# Patient Record
Sex: Male | Born: 1960 | Race: White | Hispanic: No | Marital: Married | Smoking: Current every day smoker
Health system: Southern US, Community
[De-identification: ages and names within clinical notes are randomized; demographics above are authoritative.]

## PROBLEM LIST (undated history)

## (undated) DIAGNOSIS — B1920 Unspecified viral hepatitis C without hepatic coma: Secondary | ICD-10-CM

## (undated) DIAGNOSIS — G8929 Other chronic pain: Secondary | ICD-10-CM

## (undated) DIAGNOSIS — M1990 Unspecified osteoarthritis, unspecified site: Secondary | ICD-10-CM

## (undated) DIAGNOSIS — J449 Chronic obstructive pulmonary disease, unspecified: Secondary | ICD-10-CM

## (undated) HISTORY — DX: Unspecified viral hepatitis C without hepatic coma: B19.20

## (undated) HISTORY — PX: OTHER SURGICAL HISTORY: SHX169

## (undated) HISTORY — PX: FACIAL FRACTURE SURGERY: SHX1570

## (undated) HISTORY — PX: ANKLE FRACTURE SURGERY: SHX122

---

## 2005-03-09 ENCOUNTER — Ambulatory Visit: Payer: Self-pay | Admitting: Family Medicine

## 2006-01-01 ENCOUNTER — Emergency Department (HOSPITAL_COMMUNITY): Admission: EM | Admit: 2006-01-01 | Discharge: 2006-01-02 | Payer: Self-pay | Admitting: Emergency Medicine

## 2006-01-02 ENCOUNTER — Inpatient Hospital Stay (HOSPITAL_COMMUNITY): Admission: AC | Admit: 2006-01-02 | Discharge: 2006-01-10 | Payer: Self-pay

## 2006-01-04 ENCOUNTER — Ambulatory Visit: Payer: Self-pay | Admitting: Physical Medicine & Rehabilitation

## 2006-03-01 ENCOUNTER — Ambulatory Visit (HOSPITAL_COMMUNITY): Admission: RE | Admit: 2006-03-01 | Discharge: 2006-03-01 | Payer: Self-pay | Admitting: Neurosurgery

## 2006-04-06 ENCOUNTER — Ambulatory Visit (HOSPITAL_COMMUNITY): Admission: RE | Admit: 2006-04-06 | Discharge: 2006-04-06 | Payer: Self-pay | Admitting: Neurosurgery

## 2006-05-12 ENCOUNTER — Ambulatory Visit (HOSPITAL_COMMUNITY): Admission: RE | Admit: 2006-05-12 | Discharge: 2006-05-12 | Payer: Self-pay | Admitting: Neurosurgery

## 2006-09-10 ENCOUNTER — Emergency Department (HOSPITAL_COMMUNITY): Admission: AC | Admit: 2006-09-10 | Discharge: 2006-09-10 | Payer: Self-pay | Admitting: Orthopedic Surgery
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
## 2007-08-24 ENCOUNTER — Encounter: Payer: Self-pay | Admitting: Family Medicine

## 2010-09-13 ENCOUNTER — Encounter: Payer: Self-pay | Admitting: Family Medicine

## 2010-09-22 NOTE — Letter (Signed)
Summary: Historic Patient File  Historic Patient File   Imported By: Lind Guest 07/14/2010 09:07:54  _____________________________________________________________________  External Attachment:    Type:   Image     Comment:   External Document

## 2014-08-20 ENCOUNTER — Emergency Department (HOSPITAL_COMMUNITY): Payer: Medicaid Other

## 2014-08-20 ENCOUNTER — Encounter (HOSPITAL_COMMUNITY): Payer: Self-pay | Admitting: Emergency Medicine

## 2014-08-20 ENCOUNTER — Emergency Department (HOSPITAL_COMMUNITY)
Admission: EM | Admit: 2014-08-20 | Discharge: 2014-08-20 | Disposition: A | Discharge status: Home / Self Care | Payer: Medicaid Other | Attending: Emergency Medicine | Admitting: Emergency Medicine

## 2014-08-20 DIAGNOSIS — R0781 Pleurodynia: Secondary | ICD-10-CM

## 2014-08-20 DIAGNOSIS — W19XXXA Unspecified fall, initial encounter: Secondary | ICD-10-CM

## 2014-08-20 DIAGNOSIS — W01198A Fall on same level from slipping, tripping and stumbling with subsequent striking against other object, initial encounter: Secondary | ICD-10-CM | POA: Diagnosis not present

## 2014-08-20 DIAGNOSIS — S3991XA Unspecified injury of abdomen, initial encounter: Secondary | ICD-10-CM | POA: Insufficient documentation

## 2014-08-20 DIAGNOSIS — Z8739 Personal history of other diseases of the musculoskeletal system and connective tissue: Secondary | ICD-10-CM | POA: Diagnosis not present

## 2014-08-20 DIAGNOSIS — Y998 Other external cause status: Secondary | ICD-10-CM | POA: Insufficient documentation

## 2014-08-20 DIAGNOSIS — S299XXA Unspecified injury of thorax, initial encounter: Secondary | ICD-10-CM | POA: Insufficient documentation

## 2014-08-20 DIAGNOSIS — S3992XA Unspecified injury of lower back, initial encounter: Secondary | ICD-10-CM | POA: Insufficient documentation

## 2014-08-20 DIAGNOSIS — Y9389 Activity, other specified: Secondary | ICD-10-CM | POA: Insufficient documentation

## 2014-08-20 DIAGNOSIS — Z720 Tobacco use: Secondary | ICD-10-CM | POA: Insufficient documentation

## 2014-08-20 DIAGNOSIS — Y9289 Other specified places as the place of occurrence of the external cause: Secondary | ICD-10-CM | POA: Insufficient documentation

## 2014-08-20 HISTORY — DX: Unspecified osteoarthritis, unspecified site: M19.90

## 2014-08-20 MED ORDER — OXYCODONE-ACETAMINOPHEN 5-325 MG PO TABS: 1.0000 | ORAL_TABLET | ORAL | Status: DC | PRN

## 2014-08-20 MED ORDER — IBUPROFEN 800 MG PO TABS: 800.0000 mg | ORAL_TABLET | Freq: Three times a day (TID) | ORAL | Status: DC | PRN

## 2014-08-20 MED ORDER — OXYCODONE-ACETAMINOPHEN 5-325 MG PO TABS
2.0000 | ORAL_TABLET | Freq: Once | ORAL | Status: AC
  Administered 2014-08-20: 2 via ORAL
  Filled 2014-08-20: qty 2

## 2014-08-20 NOTE — ED Provider Notes (Signed)
This chart was scribed for [place], DO by [facility], ED Scribe. The patient was seen in [facility] and the patient's care was started at 12:07 PM.  TIME SEEN: 12:07 AM  CHIEF COMPLAINT: Lower left sided back pain and left flank pain  HPI:  Curtis Baldwin is a 53 y.o. male with a history of arthritis who presents to the Emergency Department complaining of lower left sided back pain and left flank pain, onset 4 days ago after a fall. The pain radiates to the left side of his neck. Pt fell on the sidewalk 4 days ago and hit his face. Denies loss of consciousness. Not on anticoagulation. He has used a heating pad for no relief. Movement, coughing and deep breathing worsens the  pain. Pt states his last tetanus was in the last 5 years ago. He denies CP, numbness, tingling, and focal weakness. No shortness of breath. No cough. No fever.  ROS: See HPI Constitutional: no fever  Eyes: no drainage  ENT: no runny nose   Cardiovascular:  chest pain  Resp: no SOB  GI: no vomiting GU: no dysuria Integumentary: no rash  Allergy: no hives  Musculoskeletal: no leg swelling left flank pain, lower left sided back pain Neurological: no slurred speech, no numbness, no tingling, no focal weakness ROS otherwise negative  PAST MEDICAL HISTORY/PAST SURGICAL HISTORY:  Past Medical History  Diagnosis Date  . Arthritis     MEDICATIONS:  Prior to Admission medications   Not on File    ALLERGIES:  No Known Allergies  SOCIAL HISTORY:  History  Substance Use Topics  . Smoking status: Current Every Day Smoker -- 0.50 packs/day    Types: Cigarettes  . Smokeless tobacco: Not on file  . Alcohol Use: 1.2 oz/week    2 Cans of beer per week     Comment: on weekends    FAMILY HISTORY: No family history on file.  EXAM: BP 156/89 mmHg  Pulse 70  Temp(Src) 97.8 F (36.6 C) (Oral)  Resp 20  Ht 6\' 1"  (1.854 m)  Wt 145 lb (65.772 kg)  BMI 19.13 kg/m2  SpO2 100% CONSTITUTIONAL: Alert and  oriented and responds appropriately to questions. Well-appearing; well-nourished; GCS 15 HEAD: Normocephalic; atraumatic EYES: Conjunctivae clear, PERRL, EOMI ENT: normal nose; no rhinorrhea; moist mucous membranes; pharynx without lesions noted; no dental injury; no septal hematoma; large abrasion to his nose NECK: Supple, no meningismus, no LAD; no midline spinal tenderness, step-off or deformity CARD: RRR; S1 and S2 appreciated; no murmurs, no clicks, no rubs, no gallops RESP: Normal chest excursion without splinting or tachypnea; breath sounds clear and equal bilaterally; no wheezes, no rhonchi, no rales; chest wall stable, tender to palpation over the left posterior and lateral ribs without crepitus or ecchymosis or deformity ABD/GI: Normal bowel sounds; non-distended; soft, non-tender, no rebound, no guarding PELVIS:  stable, nontender to palpation BACK:  The back appears normal, there is no CVA tenderness; no midline spinal tenderness, step-off or deformity. TTP over the left posterior rib wiithout crepitous, deformity or ecchymosis. EXT: Small amount of ecchymosis over the fourth digit on the left side with no bony tenderness and full range of motion in this joint, Normal ROM in all joints; otherwise extremities are non-tender to palpation; no edema; normal capillary refill; no cyanosis   SKIN: Normal color for age and race; warm, multiple abrasion to bilateral extremities.  NEURO: Moves all extremities equally, normal sensation diffusely, cranial nerves 2-12 intact. PSYCH: The patient's mood  and manner are appropriate. Grooming and personal hygiene are appropriate.  MEDICAL DECISION MAKING: Patient here with mechanical fall 4 days ago with left posterior rib pain. Also has some bruising of the left fourth digit but denies wanting an x-ray. No other sign of trauma on exam other than abrasions. He is neurologically intact, hemodynamically stable. X-ray show no acute fracture, pneumothorax,  infiltrate. We'll discharge home with incentive spirometer, pain medication. Discussed return precautions. Patient verbalizes understanding and is comfortable with plan.    I personally performed the services described in this documentation, which was scribed in my presence. The recorded information has been reviewed and is accurate.    [place], DO 08/20/14 [phone]

## 2014-08-20 NOTE — ED Notes (Signed)
Pt stumbled and fell in the road on Friday. Has been in bed thinking he would get better. Pt is complaining of back and left rib pain, hurts worse with cough. Pain radiating into neck. Pt has bruise 4th digit on left hand. Pt has abrasion to face.

## 2014-08-20 NOTE — Discharge Instructions (Signed)
Chest Wall Pain Chest wall pain is pain in or around the bones and muscles of your chest. It may take up to 6 weeks to get better. It may take longer if you must stay physically active in your work and activities.  CAUSES  Chest wall pain may happen on its own. However, it may be caused by:  A viral illness like the flu.  Injury.  Coughing.  Exercise.  Arthritis.  Fibromyalgia.  Shingles. HOME CARE INSTRUCTIONS   Avoid overtiring physical activity. Try not to strain or perform activities that cause pain. This includes any activities using your chest or your abdominal and side muscles, especially if heavy weights are used.  Put ice on the sore area.  Put ice in a plastic bag.  Place a towel between your skin and the bag.  Leave the ice on for 15-20 minutes per hour while awake for the first 2 days.  Only take over-the-counter or prescription medicines for pain, discomfort, or fever as directed by your caregiver. SEEK IMMEDIATE MEDICAL CARE IF:   Your pain increases, or you are very uncomfortable.  You have a fever.  Your chest pain becomes worse.  You have new, unexplained symptoms.  You have nausea or vomiting.  You feel sweaty or lightheaded.  You have a cough with phlegm (sputum), or you cough up blood. MAKE SURE YOU:   Understand these instructions.  Will watch your condition.  Will get help right away if you are not doing well or get worse. Document Released: 08/09/2005 Document Revised: 11/01/2011 Document Reviewed: 04/05/2011 [facility] Patient Information 2015 [place], [place]. This information is not intended to replace advice given to you by your health care provider. Make sure you discuss any questions you have with your health care provider.   Abrasion An abrasion is a cut or scrape of the skin. Abrasions do not extend through all layers of the skin and most heal within 10 days. It is important to care for your abrasion properly to prevent  infection. CAUSES  Most abrasions are caused by falling on, or gliding across, the ground or other surface. When your skin rubs on something, the outer and inner layer of skin rubs off, causing an abrasion. DIAGNOSIS  Your caregiver will be able to diagnose an abrasion during a physical exam.  TREATMENT  Your treatment depends on how large and deep the abrasion is. Generally, your abrasion will be cleaned with water and a mild soap to remove any dirt or debris. An antibiotic ointment may be put over the abrasion to prevent an infection. A bandage (dressing) may be wrapped around the abrasion to keep it from getting dirty.  You may need a tetanus shot if:  You cannot remember when you had your last tetanus shot.  You have never had a tetanus shot.  The injury broke your skin. If you get a tetanus shot, your arm may swell, get red, and feel warm to the touch. This is common and not a problem. If you need a tetanus shot and you choose not to have one, there is a rare chance of getting tetanus. Sickness from tetanus can be serious.  HOME CARE INSTRUCTIONS   If a dressing was applied, change it at least once a day or as directed by your caregiver. If the bandage sticks, soak it off with warm water.   Wash the area with water and a mild soap to remove all the ointment 2 times a day. Rinse off the soap and  pat the area dry with a clean towel.   Reapply any ointment as directed by your caregiver. This will help prevent infection and keep the bandage from sticking. Use gauze over the wound and under the dressing to help keep the bandage from sticking.   Change your dressing right away if it becomes wet or dirty.   Only take over-the-counter or prescription medicines for pain, discomfort, or fever as directed by your caregiver.   Follow up with your caregiver within 24-48 hours for a wound check, or as directed. If you were not given a wound-check appointment, look closely at your abrasion for  redness, swelling, or pus. These are signs of infection. SEEK IMMEDIATE MEDICAL CARE IF:   You have increasing pain in the wound.   You have redness, swelling, or tenderness around the wound.   You have pus coming from the wound.   You have a fever or persistent symptoms for more than 2-3 days.  You have a fever and your symptoms suddenly get worse.  You have a bad smell coming from the wound or dressing.  MAKE SURE YOU:   Understand these instructions.  Will watch your condition.  Will get help right away if you are not doing well or get worse. Document Released: 05/19/2005 Document Revised: 07/26/2012 Document Reviewed: 07/13/2011 [facility] Patient Information 2015 [place], [place]. This information is not intended to replace advice given to you by your health care provider. Make sure you discuss any questions you have with your health care provider.

## 2014-08-20 NOTE — ED Notes (Addendum)
Incentive spirometer given. Pt given education and verbalized understanding with teach back.

## 2014-08-20 NOTE — ED Notes (Signed)
Pt in xray

## 2014-08-25 ENCOUNTER — Encounter (HOSPITAL_COMMUNITY): Payer: Self-pay | Admitting: Emergency Medicine

## 2014-08-25 ENCOUNTER — Emergency Department (HOSPITAL_COMMUNITY)
Admission: EM | Admit: 2014-08-25 | Discharge: 2014-08-25 | Disposition: A | Discharge status: Home / Self Care | Payer: Medicaid Other | Attending: Emergency Medicine | Admitting: Emergency Medicine

## 2014-08-25 DIAGNOSIS — Z720 Tobacco use: Secondary | ICD-10-CM | POA: Diagnosis not present

## 2014-08-25 DIAGNOSIS — S2232XD Fracture of one rib, left side, subsequent encounter for fracture with routine healing: Secondary | ICD-10-CM | POA: Diagnosis not present

## 2014-08-25 DIAGNOSIS — M1990 Unspecified osteoarthritis, unspecified site: Secondary | ICD-10-CM | POA: Insufficient documentation

## 2014-08-25 DIAGNOSIS — R0781 Pleurodynia: Secondary | ICD-10-CM | POA: Diagnosis present

## 2014-08-25 DIAGNOSIS — W1839XA Other fall on same level, initial encounter: Secondary | ICD-10-CM | POA: Diagnosis not present

## 2014-08-25 MED ORDER — OXYCODONE-ACETAMINOPHEN 5-325 MG PO TABS: 1.0000 | ORAL_TABLET | ORAL | Status: DC | PRN

## 2014-08-25 MED ORDER — OXYCODONE-ACETAMINOPHEN 5-325 MG PO TABS
1.0000 | ORAL_TABLET | Freq: Once | ORAL | Status: AC
  Administered 2014-08-25: 1 via ORAL
  Filled 2014-08-25: qty 1

## 2014-08-25 NOTE — Discharge Instructions (Signed)
You do have a left sided posterior rib fracture, per re-evaluation of the xray you had completed on the 29th.  This should heal without complication.  Continue using your incentive spirometer you were given at your last visit to help minimize risk of developing pneumonia from this injury.  You may take the oxycodone prescribed for pain relief.  This will make you drowsy - do not drive within 4 hours of taking this medication.  Continue taking the ibuprofen also as prescribed at your last visit.  Get rechecked if you develop increasing pain, shortness of breath or fevers.

## 2014-08-25 NOTE — ED Notes (Addendum)
Pt reports seen on Friday and diagnosed with rib pain. Pt reports continued pain. Pt denies any fever,productive cough. Pt reports pain worse with movement. nad noted. Chest expansion symmetrical. Airway patent. Pt denies any cp,sob.

## 2014-08-25 NOTE — ED Notes (Signed)
Pt states that he fell on Christmas eve on curb on street, pt with continued pain, states that ribs "pop" and "move" with movement or coughing, states he has some ibuprofen left and has ran out of percocet

## 2014-08-25 NOTE — ED Provider Notes (Signed)
CSN: [id number]     Arrival date & time 08/25/14  1215 History  This chart was scribed for non-physician practitioner, Evalee Jefferson, PA-C working with Orlie Dakin, MD by Tula Nakayama, ED scribe. This patient was seen in room APFT20/APFT20 and the patient's care was started at 1:10 PM   Chief Complaint  Patient presents with  . Rib Injury   The history is provided by the patient. No language interpreter was used.    HPI Comments: Curtis Baldwin is a 54 y.o. male with a history of arthritis who presents to the Emergency Department complaining of constant, unchanged, left posterior rib pain that started 9 days ago. He notes mild cough as an associated symptom. Pt was seen in the ED on 12/29 for the same rib pain, had x-rays, and was diagnosed with left posterior rib pain. He reports that he fell on Christmas Eve in front of his house and notes falling on his left back against the pavement edge. Pt was prescribed 15 Percocet 5-325 mg with relief, but is now out of medication. He states pain becomes worse with movement. He also reports that he hears a pop and feels his rib move when he coughs or moves. Pt smokes 1/2 ppd. He is unemployed and on disability for multiple prior injuries. Pt denies decreased appetite, shortness of breath or fever as an associated symptom.  PCP Hasanaj  Past Medical History  Diagnosis Date  . Arthritis    Past Surgical History  Procedure Laterality Date  . Ankle fracture surgery      right with pins  . Facial fracture surgery     History reviewed. No pertinent family history. History  Substance Use Topics  . Smoking status: Current Every Day Smoker -- 0.50 packs/day    Types: Cigarettes  . Smokeless tobacco: Not on file  . Alcohol Use: 1.2 oz/week    2 Cans of beer per week     Comment: on weekends    Review of Systems  Constitutional: Negative for fever, appetite change and fatigue.  Respiratory: Positive for cough. Negative for chest tightness and  shortness of breath.   Cardiovascular: Negative for chest pain.  Gastrointestinal: Negative for nausea, vomiting, abdominal pain and diarrhea.  Genitourinary: Negative for dysuria, urgency, frequency and hematuria.  Musculoskeletal: Positive for arthralgias. Negative for joint swelling, neck pain and neck stiffness.  Skin: Negative for rash.  Neurological: Negative for weakness, light-headedness, numbness and headaches.  All other systems reviewed and are negative.  Allergies  Review of patient's allergies indicates no known allergies.  Home Medications   Prior to Admission medications   Medication Sig Start Date End Date Taking? Authorizing Provider  ibuprofen (ADVIL,MOTRIN) 800 MG tablet Take 1 tablet (800 mg total) by mouth every 8 (eight) hours as needed for mild pain. 08/20/14   Kristen N Ward, DO  oxyCODONE-acetaminophen (PERCOCET/ROXICET) 5-325 MG per tablet Take 1 tablet by mouth every 4 (four) hours as needed. 08/25/14   Evalee Jefferson, PA-C   BP 166/87 mmHg  Pulse 73  Temp(Src) 98.9 F (37.2 C) (Rectal)  Resp 18  Ht 6\' 1"  (1.854 m)  Wt 145 lb (65.772 kg)  BMI 19.13 kg/m2  SpO2 100% Physical Exam  Constitutional: He appears well-developed and well-nourished.  HENT:  Head: Atraumatic.  Neck: Normal range of motion.  Cardiovascular: Normal rate, regular rhythm and normal heart sounds.   Pulses equal bilaterally  Pulmonary/Chest: Breath sounds normal.    lungs clear but with reduced effort due to  pain  Musculoskeletal: He exhibits tenderness.  Tender at left lateral mid-thorax with pain that radiates to mid-axillary line; no edema; no hematoma; no bruising  Neurological: He is alert. He has normal strength. He displays normal reflexes. No sensory deficit.  Skin: Skin is warm and dry.  Psychiatric: He has a normal mood and affect.  Nursing note and vitals reviewed.   ED Course  Procedures  DIAGNOSTIC STUDIES: Oxygen Saturation is 100% on RA, normal by my  interpretation.    COORDINATION OF CARE: 1:33 PM Spoke with radiology and reviewed x-rays from 12/29. Noted 9th left displaced posterior rib fracture.  1:34 PM Discussed treatment plan with pt at bedside and he agreed to plan.  Labs Review Labs Reviewed - No data to display  Imaging Review No results found.   EKG Interpretation None      MDM   Final diagnoses:  Left rib fracture, with routine healing, subsequent encounter    Pt has already received an incentive spirometer which he does endorse using and understands the importance. Has h/o of prior rib fx as well.  Advised f/u with pcp and /or return here for sob, fever.  Prescribed oxycodone.  Advised also may take ibuprofen q 6 hours.   I personally performed the services described in this documentation, which was scribed in my presence. The recorded information has been reviewed and is accurate.    Evalee Jefferson, PA-C 08/26/14 [place], MD 08/26/14 1445

## 2014-08-25 NOTE — ED Notes (Signed)
Pt verbalized understanding of no driving and to use caution within 4 hours of taking pain meds due to meds cause drowsiness 

## 2014-12-02 ENCOUNTER — Other Ambulatory Visit (HOSPITAL_COMMUNITY): Payer: Self-pay | Admitting: Family Medicine

## 2014-12-02 ENCOUNTER — Ambulatory Visit (HOSPITAL_COMMUNITY)
Admission: RE | Admit: 2014-12-02 | Discharge: 2014-12-02 | Disposition: A | Discharge status: Home / Self Care | Payer: Medicaid Other | Source: Ambulatory Visit | Attending: Family Medicine | Admitting: Family Medicine

## 2014-12-02 DIAGNOSIS — M545 Low back pain: Secondary | ICD-10-CM | POA: Diagnosis not present

## 2014-12-02 DIAGNOSIS — M47812 Spondylosis without myelopathy or radiculopathy, cervical region: Secondary | ICD-10-CM

## 2014-12-02 DIAGNOSIS — M5030 Other cervical disc degeneration, unspecified cervical region: Secondary | ICD-10-CM | POA: Insufficient documentation

## 2015-01-14 ENCOUNTER — Encounter (INDEPENDENT_AMBULATORY_CARE_PROVIDER_SITE_OTHER): Payer: Self-pay | Admitting: *Deleted

## 2015-01-31 ENCOUNTER — Ambulatory Visit (INDEPENDENT_AMBULATORY_CARE_PROVIDER_SITE_OTHER): Payer: Medicaid Other | Admitting: Internal Medicine

## 2015-01-31 ENCOUNTER — Encounter (INDEPENDENT_AMBULATORY_CARE_PROVIDER_SITE_OTHER): Payer: Self-pay | Admitting: Internal Medicine

## 2015-01-31 VITALS — BP 112/58 | HR 80 | Temp 98.0°F | Ht 69.0 in | Wt 145.1 lb

## 2015-01-31 DIAGNOSIS — B1921 Unspecified viral hepatitis C with hepatic coma: Secondary | ICD-10-CM

## 2015-01-31 DIAGNOSIS — B1920 Unspecified viral hepatitis C without hepatic coma: Secondary | ICD-10-CM | POA: Insufficient documentation

## 2015-01-31 DIAGNOSIS — M1990 Unspecified osteoarthritis, unspecified site: Secondary | ICD-10-CM | POA: Insufficient documentation

## 2015-01-31 LAB — CBC WITH DIFFERENTIAL/PLATELET
BASOS ABS: 0.1 10*3/uL (ref 0.0–0.1)
Basophils Relative: 1 % (ref 0–1)
EOS ABS: 0.4 10*3/uL (ref 0.0–0.7)
Eosinophils Relative: 4 % (ref 0–5)
HEMATOCRIT: 46.2 % (ref 39.0–52.0)
HEMOGLOBIN: 15.9 g/dL (ref 13.0–17.0)
LYMPHS ABS: 3.2 10*3/uL (ref 0.7–4.0)
Lymphocytes Relative: 34 % (ref 12–46)
MCH: 32.5 pg (ref 26.0–34.0)
MCHC: 34.4 g/dL (ref 30.0–36.0)
MCV: 94.5 fL (ref 78.0–100.0)
MPV: 10.2 fL (ref 8.6–12.4)
Monocytes Absolute: 1 10*3/uL (ref 0.1–1.0)
Monocytes Relative: 10 % (ref 3–12)
NEUTROS ABS: 4.8 10*3/uL (ref 1.7–7.7)
NEUTROS PCT: 51 % (ref 43–77)
Platelets: 218 10*3/uL (ref 150–400)
RBC: 4.89 MIL/uL (ref 4.22–5.81)
RDW: 13.3 % (ref 11.5–15.5)
WBC: 9.5 10*3/uL (ref 4.0–10.5)

## 2015-01-31 LAB — HEPATIC FUNCTION PANEL
ALBUMIN: 3.6 g/dL (ref 3.5–5.2)
ALK PHOS: 76 U/L (ref 39–117)
ALT: 56 U/L — ABNORMAL HIGH (ref 0–53)
AST: 63 U/L — AB (ref 0–37)
BILIRUBIN TOTAL: 0.5 mg/dL (ref 0.2–1.2)
Bilirubin, Direct: 0.2 mg/dL (ref 0.0–0.3)
Indirect Bilirubin: 0.3 mg/dL (ref 0.2–1.2)
Total Protein: 6.4 g/dL (ref 6.0–8.3)

## 2015-01-31 NOTE — Patient Instructions (Signed)
OV in 2 months.  

## 2015-01-31 NOTE — Progress Notes (Signed)
   Subjective:    Patient ID: Curtis Baldwin, male    DOB: Aug 19, 1961, 54 y.o.   MRN: [id number]  HPI Referred to our office by Dr. Cindie Laroche for Hepatitis C new diagnosis. Risk factors are multiple tattoos. Some not professional done.  Last tattoos over 30 yrs. He has not drank etoh in 4 months. He considers himself an alcoholic. He denies IV drugs. His appetite is okay. There has been no weight loss. No abdominal pain. Usually has a BM daily. Wife does not work. She occasionally cleans houses. Patient desires tx for Hepatitis C.  12/13/2014 Hep C antibiody reactive HCV RNA quaint 509326 Hep B Surface Antigen negative. TSH 2.919 T4 5.0   Have you ever been treated for Hepatitis C? no Any hx of IV drug abuse or drug abuse? no Are you drinking now? No, non in 4 months Any hx of etoh abuse? yes Do you have tattoos? Multiple tattoos  Have you ever received a blood transfusion? none When were you diagnosed with Hepatitis C? April 2016 Any hx of mental illness requiring treatment? no Do you have suicidal thoughts? no   Review of Systems Married. No children. Disabled.     Past Medical History  Diagnosis Date  . Arthritis   . Hepatitis C     Past Surgical History  Procedure Laterality Date  . Ankle fracture surgery      right with pins  . Facial fracture surgery      No Known Allergies  Current Outpatient Prescriptions on File Prior to Visit  Medication Sig Dispense Refill  . oxyCODONE-acetaminophen (PERCOCET/ROXICET) 5-325 MG per tablet Take 1 tablet by mouth every 4 (four) hours as needed. 20 tablet 0   No current facility-administered medications on file prior to visit.     Objective:   Physical Exam Blood pressure 112/58, pulse 80, temperature 98 F (36.7 C), height 5\' 9"  (1.753 m), weight 145 lb 1.6 oz (65.817 kg).  Alert and oriented. Skin warm and dry. Oral mucosa is moist.   Dentures upper.  . Sclera anicteric, conjunctivae is pink. Thyroid not enlarged. No  cervical lymphadenopathy. Lungs clear. Heart regular rate and rhythm.  Abdomen is soft. Bowel sounds are positive. No hepatomegaly. No abdominal masses felt. No tenderness.  No edema to lower extremities.         Assessment & Plan:  Hepatitis C. Patient desire tx. Labs today: AFP, CBC, Hepatic profile, PT/INR, Urine drug screen, etoh.  OV in 2 months

## 2015-02-01 LAB — AFP TUMOR MARKER: AFP TUMOR MARKER: 1.6 ng/mL (ref <6.1)

## 2015-02-01 LAB — DRUG SCREEN, URINE
Amphetamine Screen, Ur: NEGATIVE
BARBITURATE QUANT UR: NEGATIVE
BENZODIAZEPINES.: NEGATIVE
COCAINE METABOLITES: NEGATIVE
CREATININE, U: 169.34 mg/dL
MARIJUANA METABOLITE: NEGATIVE
Methadone: NEGATIVE
OPIATES: POSITIVE — AB
Phencyclidine (PCP): NEGATIVE
Propoxyphene: NEGATIVE

## 2015-02-01 LAB — PROTIME-INR
INR: 0.96 (ref <1.50)
Prothrombin Time: 12.8 seconds (ref 11.6–15.2)

## 2015-02-01 LAB — ETHANOL

## 2015-02-05 LAB — HEPATITIS C RNA QUANTITATIVE
HCV Quantitative Log: 7.35 {Log} — ABNORMAL HIGH (ref <1.18)
HCV Quantitative: 22136173 IU/mL — ABNORMAL HIGH (ref <15)

## 2015-02-07 LAB — HEPATITIS C GENOTYPE

## 2015-02-14 ENCOUNTER — Ambulatory Visit (HOSPITAL_COMMUNITY)
Admission: RE | Admit: 2015-02-14 | Discharge: 2015-02-14 | Disposition: A | Discharge status: Home / Self Care | Payer: Medicaid Other | Source: Ambulatory Visit | Attending: Internal Medicine | Admitting: Internal Medicine

## 2015-02-14 DIAGNOSIS — K760 Fatty (change of) liver, not elsewhere classified: Secondary | ICD-10-CM | POA: Diagnosis not present

## 2015-02-14 DIAGNOSIS — B182 Chronic viral hepatitis C: Secondary | ICD-10-CM | POA: Diagnosis not present

## 2015-02-14 DIAGNOSIS — B1921 Unspecified viral hepatitis C with hepatic coma: Secondary | ICD-10-CM

## 2015-02-17 ENCOUNTER — Telehealth (INDEPENDENT_AMBULATORY_CARE_PROVIDER_SITE_OTHER): Payer: Self-pay | Admitting: Internal Medicine

## 2015-02-17 NOTE — Telephone Encounter (Signed)
This will be handled on 02-19-15.

## 2015-02-17 NOTE — Telephone Encounter (Signed)
Tammy, Hep C patient.  Will treat with either Harvoni x 12 weeks or viekira Pack and Ribavirin 1000mg  x 12 weeks.

## 2015-02-19 ENCOUNTER — Telehealth (INDEPENDENT_AMBULATORY_CARE_PROVIDER_SITE_OTHER): Payer: Self-pay | Admitting: Internal Medicine

## 2015-02-19 NOTE — Telephone Encounter (Signed)
Curtis Baldwin  August 23, 2061 Genotype 1A 02/14/2015 [place] elastrography F2-F3 02/17/2015 Submitted to tammy  .    Expand All Collapse All   Tammy, Hep C patient.  Will treat with either Harvoni x 12 weeks or viekira Pack and Ribavirin 1000mg  x 12 weeks.

## 2015-02-19 NOTE — Telephone Encounter (Signed)
Paperwork has been completed for Viekira/Ribavirin. Patient will need to come and sign readiness to treat form that is required by his insurance. Patient to be contacted. Once we have heard from [employer] we will contact the patient.

## 2015-02-19 NOTE — Telephone Encounter (Signed)
   Expand All Collapse All   Curtis Baldwin, Hep C patient.  Will treat with either Harvoni x 12 weeks or viekira Pack and Ribavirin 1000mg  x 12 weeks.

## 2015-03-06 ENCOUNTER — Encounter (INDEPENDENT_AMBULATORY_CARE_PROVIDER_SITE_OTHER): Payer: Self-pay

## 2015-03-10 ENCOUNTER — Telehealth (INDEPENDENT_AMBULATORY_CARE_PROVIDER_SITE_OTHER): Payer: Self-pay | Admitting: *Deleted

## 2015-03-10 NOTE — Telephone Encounter (Signed)
Keylin called and said he started his medicine on 03/06/15. He received a 3 month supply and paperwork that needed to be filled out. He is needing help with the paperwork. His return phone number is [phone].

## 2015-03-11 NOTE — Telephone Encounter (Signed)
Curtis Baldwin, Needs OV in 4 weeks.   Tammy, CBC with diff and Hepatic function in 2 weeks

## 2015-03-11 NOTE — Telephone Encounter (Signed)
I have left him a message.

## 2015-03-12 ENCOUNTER — Telehealth (INDEPENDENT_AMBULATORY_CARE_PROVIDER_SITE_OTHER): Payer: Self-pay | Admitting: *Deleted

## 2015-03-12 DIAGNOSIS — B182 Chronic viral hepatitis C: Secondary | ICD-10-CM

## 2015-03-12 NOTE — Telephone Encounter (Signed)
Lab is noted for for 2 weeks.

## 2015-03-12 NOTE — Telephone Encounter (Signed)
.  Per Lelon Perla patient is to have lab work in 2 weeks per Karna Christmas.

## 2015-03-14 NOTE — Telephone Encounter (Signed)
Apt will go and have blood work on 03/26/15 and apt has been scheduled for 04/03/15. Voices understood.

## 2015-03-19 ENCOUNTER — Encounter (INDEPENDENT_AMBULATORY_CARE_PROVIDER_SITE_OTHER): Payer: Self-pay | Admitting: *Deleted

## 2015-03-19 ENCOUNTER — Other Ambulatory Visit (INDEPENDENT_AMBULATORY_CARE_PROVIDER_SITE_OTHER): Payer: Self-pay | Admitting: *Deleted

## 2015-03-19 DIAGNOSIS — B182 Chronic viral hepatitis C: Secondary | ICD-10-CM

## 2015-04-01 LAB — HEPATIC FUNCTION PANEL
ALBUMIN: 3.5 g/dL — AB (ref 3.6–5.1)
ALK PHOS: 67 U/L (ref 40–115)
ALT: 21 U/L (ref 9–46)
AST: 25 U/L (ref 10–35)
BILIRUBIN DIRECT: 0.1 mg/dL (ref <=0.2)
Indirect Bilirubin: 0.4 mg/dL (ref 0.2–1.2)
TOTAL PROTEIN: 6.4 g/dL (ref 6.1–8.1)
Total Bilirubin: 0.5 mg/dL (ref 0.2–1.2)

## 2015-04-01 LAB — CBC WITH DIFFERENTIAL/PLATELET
BASOS PCT: 1 % (ref 0–1)
Basophils Absolute: 0.1 10*3/uL (ref 0.0–0.1)
EOS ABS: 0.3 10*3/uL (ref 0.0–0.7)
EOS PCT: 4 % (ref 0–5)
HEMATOCRIT: 38.6 % — AB (ref 39.0–52.0)
Hemoglobin: 13 g/dL (ref 13.0–17.0)
LYMPHS PCT: 42 % (ref 12–46)
Lymphs Abs: 3.1 10*3/uL (ref 0.7–4.0)
MCH: 31.9 pg (ref 26.0–34.0)
MCHC: 33.7 g/dL (ref 30.0–36.0)
MCV: 94.8 fL (ref 78.0–100.0)
MONO ABS: 0.6 10*3/uL (ref 0.1–1.0)
MPV: 9.3 fL (ref 8.6–12.4)
Monocytes Relative: 8 % (ref 3–12)
NEUTROS ABS: 3.3 10*3/uL (ref 1.7–7.7)
Neutrophils Relative %: 45 % (ref 43–77)
PLATELETS: 227 10*3/uL (ref 150–400)
RBC: 4.07 MIL/uL — ABNORMAL LOW (ref 4.22–5.81)
RDW: 12.4 % (ref 11.5–15.5)
WBC: 7.4 10*3/uL (ref 4.0–10.5)

## 2015-04-02 LAB — HEPATITIS C RNA QUANTITATIVE: HCV Quantitative: NOT DETECTED IU/mL (ref <15)

## 2015-04-03 ENCOUNTER — Encounter (INDEPENDENT_AMBULATORY_CARE_PROVIDER_SITE_OTHER): Payer: Self-pay | Admitting: Internal Medicine

## 2015-04-03 ENCOUNTER — Telehealth (INDEPENDENT_AMBULATORY_CARE_PROVIDER_SITE_OTHER): Payer: Self-pay | Admitting: *Deleted

## 2015-04-03 ENCOUNTER — Ambulatory Visit (INDEPENDENT_AMBULATORY_CARE_PROVIDER_SITE_OTHER): Payer: Medicaid Other | Admitting: Internal Medicine

## 2015-04-03 VITALS — BP 112/58 | HR 64 | Temp 98.1°F | Ht 72.0 in | Wt 144.5 lb

## 2015-04-03 DIAGNOSIS — B1920 Unspecified viral hepatitis C without hepatic coma: Secondary | ICD-10-CM | POA: Diagnosis not present

## 2015-04-03 DIAGNOSIS — B182 Chronic viral hepatitis C: Secondary | ICD-10-CM

## 2015-04-03 NOTE — Patient Instructions (Addendum)
OV in 8 week. CBC with diff, Hepatic function and Hep C quaint in 4 weeks.
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
## 2015-04-03 NOTE — Telephone Encounter (Signed)
.  Per Curtis Baldwin patient to have lab work done in 4 weeks.

## 2015-04-04 ENCOUNTER — Ambulatory Visit (INDEPENDENT_AMBULATORY_CARE_PROVIDER_SITE_OTHER): Payer: Medicaid Other | Admitting: Internal Medicine

## 2015-04-09 ENCOUNTER — Encounter (HOSPITAL_COMMUNITY): Payer: Self-pay

## 2015-04-09 ENCOUNTER — Emergency Department (HOSPITAL_COMMUNITY): Payer: Medicaid Other

## 2015-04-09 ENCOUNTER — Emergency Department (HOSPITAL_COMMUNITY)
Admission: EM | Admit: 2015-04-09 | Discharge: 2015-04-09 | Disposition: A | Discharge status: Home / Self Care | Payer: Medicaid Other | Attending: Emergency Medicine | Admitting: Emergency Medicine

## 2015-04-09 DIAGNOSIS — W01198A Fall on same level from slipping, tripping and stumbling with subsequent striking against other object, initial encounter: Secondary | ICD-10-CM | POA: Diagnosis not present

## 2015-04-09 DIAGNOSIS — S20212A Contusion of left front wall of thorax, initial encounter: Secondary | ICD-10-CM | POA: Diagnosis not present

## 2015-04-09 DIAGNOSIS — Z8619 Personal history of other infectious and parasitic diseases: Secondary | ICD-10-CM | POA: Insufficient documentation

## 2015-04-09 DIAGNOSIS — Z720 Tobacco use: Secondary | ICD-10-CM | POA: Diagnosis not present

## 2015-04-09 DIAGNOSIS — Y998 Other external cause status: Secondary | ICD-10-CM | POA: Insufficient documentation

## 2015-04-09 DIAGNOSIS — Y92000 Kitchen of unspecified non-institutional (private) residence as  the place of occurrence of the external cause: Secondary | ICD-10-CM | POA: Diagnosis not present

## 2015-04-09 DIAGNOSIS — M1990 Unspecified osteoarthritis, unspecified site: Secondary | ICD-10-CM | POA: Diagnosis not present

## 2015-04-09 DIAGNOSIS — S29001A Unspecified injury of muscle and tendon of front wall of thorax, initial encounter: Secondary | ICD-10-CM | POA: Diagnosis present

## 2015-04-09 DIAGNOSIS — Y9389 Activity, other specified: Secondary | ICD-10-CM | POA: Diagnosis not present

## 2015-04-09 MED ORDER — ACETAMINOPHEN-CODEINE #3 300-30 MG PO TABS
2.0000 | ORAL_TABLET | Freq: Once | ORAL | Status: AC
  Administered 2015-04-09: 2 via ORAL
  Filled 2015-04-09: qty 1
  Filled 2015-04-09: qty 2

## 2015-04-09 MED ORDER — KETOROLAC TROMETHAMINE 10 MG PO TABS
10.0000 mg | ORAL_TABLET | Freq: Once | ORAL | Status: AC
  Administered 2015-04-09: 10 mg via ORAL
  Filled 2015-04-09: qty 1

## 2015-04-09 MED ORDER — IBUPROFEN 800 MG PO TABS: 800.0000 mg | ORAL_TABLET | Freq: Three times a day (TID) | ORAL | Status: DC

## 2015-04-09 MED ORDER — ACETAMINOPHEN-CODEINE #3 300-30 MG PO TABS: 1.0000 | ORAL_TABLET | Freq: Four times a day (QID) | ORAL | Status: DC | PRN

## 2015-04-09 MED ORDER — METHOCARBAMOL 500 MG PO TABS
1000.0000 mg | ORAL_TABLET | Freq: Once | ORAL | Status: AC
  Administered 2015-04-09: 1000 mg via ORAL
  Filled 2015-04-09: qty 2

## 2015-04-09 MED ORDER — METHOCARBAMOL 500 MG PO TABS: 500.0000 mg | ORAL_TABLET | Freq: Three times a day (TID) | ORAL | Status: DC

## 2015-04-09 NOTE — ED Provider Notes (Signed)
CSN: [id number]     Arrival date & time 04/09/15  1149 History   First MD Initiated Contact with Patient 04/09/15 1316     Chief Complaint  Patient presents with  . rib pain      (Consider location/radiation/quality/duration/timing/severity/associated sxs/prior Treatment) Patient is a 54 y.o. male presenting with chest pain. The history is provided by the patient.  Chest Pain Pain location:  L lateral chest Pain quality: aching and stabbing   Pain radiates to the back: no   Pain severity:  Moderate Onset quality:  Sudden Duration:  2 days Timing:  Constant Progression:  Worsening Chronicity:  New Context: breathing and movement   Relieved by:  Nothing Worsened by:  Coughing and movement Associated symptoms: no fever and no syncope   Risk factors: smoking     Past Medical History  Diagnosis Date  . Arthritis   . Hepatitis C    Past Surgical History  Procedure Laterality Date  . Ankle fracture surgery      right with pins  . Facial fracture surgery     No family history on file. Social History  Substance Use Topics  . Smoking status: Current Every Day Smoker -- 0.50 packs/day    Types: Cigarettes  . Smokeless tobacco: None     Comment: 1/2 pack a day  . Alcohol Use: No     Comment: former    Review of Systems  Constitutional: Negative for fever.  Cardiovascular: Positive for chest pain. Negative for syncope.  Musculoskeletal: Positive for arthralgias.  All other systems reviewed and are negative.     Allergies  Review of patient's allergies indicates no known allergies.  Home Medications   Prior to Admission medications   Medication Sig Start Date End Date Taking? Authorizing Provider  HYDROcodone-acetaminophen (NORCO/VICODIN) 5-325 MG per tablet Take 1 tablet by mouth every 6 (six) hours as needed for moderate pain.    Historical Provider, MD  Ombitas-Paritapre-Ritona-Dasab Linzie Collin PAK PO) Take by mouth.    Historical Provider, MD    oxyCODONE-acetaminophen (PERCOCET/ROXICET) 5-325 MG per tablet Take 1 tablet by mouth every 4 (four) hours as needed. 08/25/14   Evalee Jefferson, PA-C  ribavirin (REBETOL) 200 MG capsule Take 1,000 mg by mouth.    Historical Provider, MD   BP 125/74 mmHg  Pulse 69  Temp(Src) 98.7 F (37.1 C) (Oral)  Resp 20  Ht 5\' 9"  (1.753 m)  Wt 145 lb (65.772 kg)  BMI 21.40 kg/m2  SpO2 100% Physical Exam  Constitutional: He is oriented to person, place, and time. He appears well-developed and well-nourished.  Non-toxic appearance.  HENT:  Head: Normocephalic.  Right Ear: Tympanic membrane and external ear normal.  Left Ear: Tympanic membrane and external ear normal.  Eyes: EOM and lids are normal. Pupils are equal, round, and reactive to light.  Neck: Normal range of motion. Neck supple. Carotid bruit is not present.  Cardiovascular: Normal rate, regular rhythm, normal heart sounds, intact distal pulses and normal pulses.   Pulmonary/Chest: Breath sounds normal. No respiratory distress. He exhibits tenderness. He exhibits no crepitus and no retraction.    Abdominal: Soft. Bowel sounds are normal. There is no tenderness. There is no guarding.  Musculoskeletal: Normal range of motion.  Lymphadenopathy:       Head (right side): No submandibular adenopathy present.       Head (left side): No submandibular adenopathy present.    He has no cervical adenopathy.  Neurological: He is alert and oriented to person,  place, and time. He has normal strength. No cranial nerve deficit or sensory deficit.  Skin: Skin is warm and dry.  Psychiatric: He has a normal mood and affect. His speech is normal.  Nursing note and vitals reviewed.   ED Course  Procedures (including critical care time) Labs Review Labs Reviewed - No data to display  Imaging Review Dg Ribs Unilateral W/chest Left  04/09/2015   CLINICAL DATA:  Fall from stool 2 nights ago. Anterior left rib pain.  EXAM: LEFT RIBS AND CHEST - 3+ VIEW   COMPARISON:  Chest and left rib radiographs 08/20/2014.  FINDINGS: Remote left-sided third through eighth rib fractures are again noted. No acute fractures are present. There is no pneumothorax. Remote right-sided rib fractures are noted as well. Focal density projected over the left hemi thorax on the first 2 images likely represents a nipple shadow. It is not present on the third image.  The heart size is normal. The lungs are clear. The visualized soft tissues and bony thorax are otherwise unremarkable.  IMPRESSION: 1. Remote bilateral rib fractures. 2. No acute cardiopulmonary disease.   Electronically Signed   By: San Morelle M.D.   On: 04/09/2015 12:18   I have personally reviewed and evaluated these images and lab results as part of my medical decision-making.   EKG Interpretation None      MDM  Xray reveals remote bilateral rib fractures. No new fx. No cardiopulmonary disease. Vital signs stble. Pulse ox 100% on room air. Pt made aware of findings. Incentive spirometry ordered for the patient. Rx for tylenol codeine, robaxin, and ibuprofen given  To the patient.   Final diagnoses:  None    **I have reviewed nursing notes, vital signs, and all appropriate lab and imaging results for this patient.Lily Kocher, PA-C 04/11/15 1524  Virgel Manifold, MD 04/16/15 1415

## 2015-04-09 NOTE — ED Notes (Signed)
Pt reports fell off of a foot stool in his kitchen 2 days ago and hit left side on his stove.  Pt says has been in the bed for the past 2 days due to left sided rib pain.  Denies SOB.

## 2015-04-09 NOTE — Discharge Instructions (Signed)
Rib Contusion °A rib contusion (bruise) can occur by a blow to the chest or by a fall against a hard object. Usually these will be much better in a couple weeks. If X-rays were taken today and there are no broken bones (fractures), the diagnosis of bruising is made. However, broken ribs may not show up for several days, or may be discovered later on a routine X-ray when signs of healing show up. If this happens to you, it does not mean that something was missed on the X-ray, but simply that it did not show up on the first X-rays. Earlier diagnosis will not usually change the treatment. °HOME CARE INSTRUCTIONS  °· Avoid strenuous activity. Be careful during activities and avoid bumping the injured ribs. Activities that pull on the injured ribs and cause pain should be avoided, if possible. °· For the first day or two, an ice pack used every 20 minutes while awake may be helpful. Put ice in a plastic bag and put a towel between the bag and the skin. °· Eat a normal, well-balanced diet. Drink plenty of fluids to avoid constipation. °· Take deep breaths several times a day to keep lungs free of infection. Try to cough several times a day. Splint the injured area with a pillow while coughing to ease pain. Coughing can help prevent pneumonia. °· Wear a rib belt or binder only if told to do so by your caregiver. If you are wearing a rib belt or binder, you must do the breathing exercises as directed by your caregiver. If not used properly, rib belts or binders restrict breathing which can lead to pneumonia. °· Only take over-the-counter or prescription medicines for pain, discomfort, or fever as directed by your caregiver. °SEEK MEDICAL CARE IF:  °· You or your child has an oral temperature above 102° F (38.9° C). °· Your baby is older than 3 months with a rectal temperature of 100.5° F (38.1° C) or higher for more than 1 day. °· You develop a cough, with thick or bloody sputum. °SEEK IMMEDIATE MEDICAL CARE IF:  °· You  have difficulty breathing. °· You feel sick to your stomach (nausea), have vomiting or belly (abdominal) pain. °· You have worsening pain, not controlled with medications, or there is a change in the location of the pain. °· You develop sweating or radiation of the pain into the arms, jaw or shoulders, or become light headed or faint. °· You or your child has an oral temperature above 102° F (38.9° C), not controlled by medicine. °· Your or your baby is older than 3 months with a rectal temperature of 102° F (38.9° C) or higher. °· Your baby is 3 months old or younger with a rectal temperature of 100.4° F (38° C) or higher. °MAKE SURE YOU:  °· Understand these instructions. °· Will watch your condition. °· Will get help right away if you are not doing well or get worse. °Document Released: 05/04/2001 Document Revised: 12/04/2012 Document Reviewed: 03/27/2008 °ExitCare® Patient Information ©2015 [employer]. This information is not intended to replace advice given to you by your health care provider. Make sure you discuss any questions you have with your health care provider. ° °

## 2015-04-15 ENCOUNTER — Other Ambulatory Visit (INDEPENDENT_AMBULATORY_CARE_PROVIDER_SITE_OTHER): Payer: Self-pay | Admitting: *Deleted

## 2015-04-15 ENCOUNTER — Encounter (INDEPENDENT_AMBULATORY_CARE_PROVIDER_SITE_OTHER): Payer: Self-pay | Admitting: *Deleted

## 2015-04-15 DIAGNOSIS — B182 Chronic viral hepatitis C: Secondary | ICD-10-CM

## 2015-05-02 LAB — HEPATIC FUNCTION PANEL
ALK PHOS: 85 U/L (ref 40–115)
ALT: 18 U/L (ref 9–46)
AST: 25 U/L (ref 10–35)
Albumin: 4 g/dL (ref 3.6–5.1)
BILIRUBIN DIRECT: 0.2 mg/dL (ref <=0.2)
BILIRUBIN INDIRECT: 0.5 mg/dL (ref 0.2–1.2)
Total Bilirubin: 0.7 mg/dL (ref 0.2–1.2)
Total Protein: 7.1 g/dL (ref 6.1–8.1)

## 2015-05-02 LAB — CBC WITH DIFFERENTIAL/PLATELET
BASOS ABS: 0 10*3/uL (ref 0.0–0.1)
Basophils Relative: 0 % (ref 0–1)
Eosinophils Absolute: 0.2 10*3/uL (ref 0.0–0.7)
Eosinophils Relative: 2 % (ref 0–5)
HEMATOCRIT: 40.5 % (ref 39.0–52.0)
HEMOGLOBIN: 13.9 g/dL (ref 13.0–17.0)
LYMPHS PCT: 35 % (ref 12–46)
Lymphs Abs: 2.7 10*3/uL (ref 0.7–4.0)
MCH: 32.5 pg (ref 26.0–34.0)
MCHC: 34.3 g/dL (ref 30.0–36.0)
MCV: 94.6 fL (ref 78.0–100.0)
MONO ABS: 0.6 10*3/uL (ref 0.1–1.0)
MPV: 9.4 fL (ref 8.6–12.4)
Monocytes Relative: 8 % (ref 3–12)
NEUTROS ABS: 4.3 10*3/uL (ref 1.7–7.7)
NEUTROS PCT: 55 % (ref 43–77)
Platelets: 291 10*3/uL (ref 150–400)
RBC: 4.28 MIL/uL (ref 4.22–5.81)
RDW: 12.8 % (ref 11.5–15.5)
WBC: 7.8 10*3/uL (ref 4.0–10.5)

## 2015-05-04 LAB — HEPATITIS C RNA QUANTITATIVE: HCV QUANT: NOT DETECTED [IU]/mL (ref <15)

## 2015-05-07 ENCOUNTER — Telehealth (INDEPENDENT_AMBULATORY_CARE_PROVIDER_SITE_OTHER): Payer: Self-pay | Admitting: *Deleted

## 2015-05-07 DIAGNOSIS — B182 Chronic viral hepatitis C: Secondary | ICD-10-CM

## 2015-05-07 NOTE — Telephone Encounter (Signed)
.  Per Lelon Perla patient is to have lab work in 4 weeks. Labs are noted for October 2016.

## 2015-05-13 ENCOUNTER — Other Ambulatory Visit (INDEPENDENT_AMBULATORY_CARE_PROVIDER_SITE_OTHER): Payer: Self-pay | Admitting: *Deleted

## 2015-05-13 ENCOUNTER — Encounter (INDEPENDENT_AMBULATORY_CARE_PROVIDER_SITE_OTHER): Payer: Self-pay | Admitting: *Deleted

## 2015-05-13 DIAGNOSIS — B182 Chronic viral hepatitis C: Secondary | ICD-10-CM

## 2015-05-29 ENCOUNTER — Ambulatory Visit (INDEPENDENT_AMBULATORY_CARE_PROVIDER_SITE_OTHER): Payer: Medicaid Other | Admitting: Internal Medicine

## 2015-05-29 ENCOUNTER — Encounter (INDEPENDENT_AMBULATORY_CARE_PROVIDER_SITE_OTHER): Payer: Self-pay | Admitting: Internal Medicine

## 2015-05-29 VITALS — BP 106/62 | HR 60 | Temp 98.1°F | Ht 72.0 in | Wt 139.6 lb

## 2015-05-29 DIAGNOSIS — B1920 Unspecified viral hepatitis C without hepatic coma: Secondary | ICD-10-CM

## 2015-05-29 LAB — CBC
HCT: 39.1 % (ref 39.0–52.0)
Hemoglobin: 13.5 g/dL (ref 13.0–17.0)
MCH: 32 pg (ref 26.0–34.0)
MCHC: 34.5 g/dL (ref 30.0–36.0)
MCV: 92.7 fL (ref 78.0–100.0)
MPV: 9.5 fL (ref 8.6–12.4)
PLATELETS: 263 10*3/uL (ref 150–400)
RBC: 4.22 MIL/uL (ref 4.22–5.81)
RDW: 12.6 % (ref 11.5–15.5)
WBC: 7.9 10*3/uL (ref 4.0–10.5)

## 2015-05-29 LAB — HEPATITIS C RNA QUANTITATIVE: HCV Quantitative: NOT DETECTED IU/mL (ref <15)

## 2015-05-29 LAB — HEPATIC FUNCTION PANEL
ALT: 17 U/L (ref 9–46)
AST: 22 U/L (ref 10–35)
Albumin: 3.8 g/dL (ref 3.6–5.1)
Alkaline Phosphatase: 65 U/L (ref 40–115)
BILIRUBIN DIRECT: 0.1 mg/dL (ref <=0.2)
BILIRUBIN INDIRECT: 0.5 mg/dL (ref 0.2–1.2)
TOTAL PROTEIN: 6.7 g/dL (ref 6.1–8.1)
Total Bilirubin: 0.6 mg/dL (ref 0.2–1.2)
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
## 2015-05-29 NOTE — Patient Instructions (Addendum)
OV in 6 months with a CBC diff, Hepatic function, and Hep C quaintg

## 2015-05-30 ENCOUNTER — Telehealth (INDEPENDENT_AMBULATORY_CARE_PROVIDER_SITE_OTHER): Payer: Self-pay | Admitting: *Deleted

## 2015-05-30 DIAGNOSIS — B182 Chronic viral hepatitis C: Secondary | ICD-10-CM

## 2015-05-30 NOTE — Telephone Encounter (Signed)
.  Per Terri Setzer,NP patient to have labs in 6 months. 

## 2015-08-04 ENCOUNTER — Ambulatory Visit (HOSPITAL_COMMUNITY)
Admission: RE | Admit: 2015-08-04 | Discharge: 2015-08-04 | Disposition: A | Discharge status: Home / Self Care | Payer: Medicaid Other | Source: Ambulatory Visit | Attending: Family Medicine | Admitting: Family Medicine

## 2015-08-04 ENCOUNTER — Other Ambulatory Visit (HOSPITAL_COMMUNITY): Payer: Self-pay | Admitting: Family Medicine

## 2015-08-04 DIAGNOSIS — M545 Low back pain: Secondary | ICD-10-CM | POA: Diagnosis present

## 2015-08-04 DIAGNOSIS — M542 Cervicalgia: Secondary | ICD-10-CM | POA: Diagnosis not present

## 2015-08-04 DIAGNOSIS — G8929 Other chronic pain: Secondary | ICD-10-CM | POA: Insufficient documentation

## 2015-08-04 DIAGNOSIS — M1990 Unspecified osteoarthritis, unspecified site: Secondary | ICD-10-CM

## 2015-10-22 ENCOUNTER — Other Ambulatory Visit (HOSPITAL_COMMUNITY): Payer: Self-pay | Admitting: Family Medicine

## 2015-10-22 DIAGNOSIS — M542 Cervicalgia: Secondary | ICD-10-CM

## 2015-10-31 ENCOUNTER — Ambulatory Visit (HOSPITAL_COMMUNITY)
Admission: RE | Admit: 2015-10-31 | Discharge: 2015-10-31 | Disposition: A | Discharge status: Home / Self Care | Payer: Medicaid Other | Source: Ambulatory Visit | Attending: Family Medicine | Admitting: Family Medicine

## 2015-10-31 DIAGNOSIS — M50321 Other cervical disc degeneration at C4-C5 level: Secondary | ICD-10-CM | POA: Diagnosis not present

## 2015-10-31 DIAGNOSIS — M4692 Unspecified inflammatory spondylopathy, cervical region: Secondary | ICD-10-CM | POA: Insufficient documentation

## 2015-10-31 DIAGNOSIS — M50223 Other cervical disc displacement at C6-C7 level: Secondary | ICD-10-CM | POA: Diagnosis not present

## 2015-10-31 DIAGNOSIS — M47892 Other spondylosis, cervical region: Secondary | ICD-10-CM | POA: Diagnosis not present

## 2015-10-31 DIAGNOSIS — M50222 Other cervical disc displacement at C5-C6 level: Secondary | ICD-10-CM | POA: Diagnosis not present

## 2015-10-31 DIAGNOSIS — M50221 Other cervical disc displacement at C4-C5 level: Secondary | ICD-10-CM | POA: Diagnosis not present

## 2015-10-31 DIAGNOSIS — M542 Cervicalgia: Secondary | ICD-10-CM | POA: Diagnosis present

## 2015-11-19 ENCOUNTER — Encounter (INDEPENDENT_AMBULATORY_CARE_PROVIDER_SITE_OTHER): Payer: Self-pay | Admitting: *Deleted

## 2015-11-19 ENCOUNTER — Other Ambulatory Visit (INDEPENDENT_AMBULATORY_CARE_PROVIDER_SITE_OTHER): Payer: Self-pay | Admitting: *Deleted

## 2015-11-19 DIAGNOSIS — B182 Chronic viral hepatitis C: Secondary | ICD-10-CM

## 2015-11-27 ENCOUNTER — Ambulatory Visit (INDEPENDENT_AMBULATORY_CARE_PROVIDER_SITE_OTHER): Payer: Medicaid Other | Admitting: Internal Medicine

## 2015-11-27 ENCOUNTER — Encounter (INDEPENDENT_AMBULATORY_CARE_PROVIDER_SITE_OTHER): Payer: Self-pay | Admitting: Internal Medicine

## 2015-11-27 VITALS — BP 130/54 | HR 60 | Temp 98.0°F | Ht 72.0 in | Wt 144.4 lb

## 2015-11-27 DIAGNOSIS — B1920 Unspecified viral hepatitis C without hepatic coma: Secondary | ICD-10-CM

## 2015-11-27 LAB — CBC
HCT: 47.8 % (ref 38.5–50.0)
HEMOGLOBIN: 16.4 g/dL (ref 13.2–17.1)
MCH: 32.9 pg (ref 27.0–33.0)
MCHC: 34.3 g/dL (ref 32.0–36.0)
MCV: 95.8 fL (ref 80.0–100.0)
MPV: 9 fL (ref 7.5–12.5)
PLATELETS: 223 10*3/uL (ref 140–400)
RBC: 4.99 MIL/uL (ref 4.20–5.80)
RDW: 13.1 % (ref 11.0–15.0)
WBC: 9.6 10*3/uL (ref 3.8–10.8)

## 2015-11-27 NOTE — Progress Notes (Signed)
Subjective:    Patient ID: Curtis Baldwin, male    DOB: August 03, 1961, 55 y.o.   MRN: [id number]  HPI here today for f/u of his Hepatitis C. Genotype 1A 02/14/2015 [place] elastrography F2-F3 Started tx 03/06/2015 with Linzie Collin and Ribavirin 1067m x 12 weeks He has finished Hep C tx   He has cleared the virus 03/31/2015. Liver enzymes are normal.  05/28/2015 Hep C quaint undetected.  States he is doing good. He does c/o back pain. He has received steroid shots in his back.   Appetite is good. He has gained 5 pounds since his last visit in October. He is doing wPsychologist, counselling Usually has a BM daily.    CBC    Component Value Date/Time   WBC 7.9 05/28/2015 1056   RBC 4.22 05/28/2015 1056   HGB 13.5 05/28/2015 1056   HCT 39.1 05/28/2015 1056   PLT 263 05/28/2015 1056   MCV 92.7 05/28/2015 1056   MCH 32.0 05/28/2015 1056   MCHC 34.5 05/28/2015 1056   RDW 12.6 05/28/2015 1056   LYMPHSABS 2.7 05/01/2015 1321   MONOABS 0.6 05/01/2015 1321   EOSABS 0.2 05/01/2015 1321   BASOSABS 0.0 05/01/2015 1321      Hepatic Function Latest Ref Rng 05/28/2015 05/01/2015 03/31/2015  Total Protein 6.1 - 8.1 g/dL 6.7 7.1 6.4  Albumin 3.6 - 5.1 g/dL 3.8 4.0 3.5(L)  AST 10 - 35 U/L _0 ALT 9 - 46 U/L _1 Alk Phosphatase 40 - 115 U/L 65 85 67  Total Bilirubin 0.2 - 1.2 mg/dL 0.6 0.7 0.5  Bilirubin, Direct <=0.2 mg/dL 0.1 0.2 0.1        Review of Systems Past Medical History  Diagnosis Date  . Arthritis   . Hepatitis C     Past Surgical History  Procedure Laterality Date  . Ankle fracture surgery      right with pins  . Facial fracture surgery      No Known Allergies  Current Outpatient Prescriptions on File Prior to Visit  Medication Sig Dispense Refill  . acetaminophen-codeine (TYLENOL #3) 300-30 MG per tablet Take 1-2 tablets by mouth every 6 (six) hours as needed for moderate pain. 20 tablet 0  . HYDROcodone-acetaminophen (NORCO/VICODIN) 5-325 MG per tablet Take 1 tablet by mouth  every 6 (six) hours as needed for moderate pain.    .Marland Kitchenibuprofen (ADVIL,MOTRIN) 800 MG tablet Take 1 tablet (800 mg total) by mouth 3 (three) times daily. 21 tablet 0  . methocarbamol (ROBAXIN) 500 MG tablet Take 1 tablet (500 mg total) by mouth 3 (three) times daily. 21 tablet 0  . Ombitas-Paritapre-Ritona-Dasab (VIEKIRA PAK PO) Take 3-6 tablets by mouth 2 (two) times daily.     .Marland KitchenoxyCODONE-acetaminophen (PERCOCET/ROXICET) 5-325 MG per tablet Take 1 tablet by mouth every 4 (four) hours as needed. 20 tablet 0  . ribavirin (REBETOL) 200 MG capsule Take 1,000 mg by mouth daily.      No current facility-administered medications on file prior to visit.        Objective:   Physical ExamBlood pressure 130/54, pulse 60, temperature 98 F (36.7 C), height 6' (1.829 m), weight 144 lb 6.4 oz (65.499 kg). Alert and oriented. Skin warm and dry. Oral mucosa is moist.  Dentures. . Sclera anicteric, conjunctivae is pink. Thyroid not enlarged. No cervical lymphadenopathy. Lungs clear. Heart regular rate and rhythm.  Abdomen is soft. Bowel sounds are positive. No hepatomegaly. No abdominal masses felt. No tenderness.  No edema to lower extremities.          Assessment & Plan:  Hepatitis C. He has cleared the virus. He is doing well.  Hep C quaint, CBC, Hepatic function, [place] RUQ for surveillance. Ov in 1 year.

## 2015-11-27 NOTE — Patient Instructions (Signed)
Labs today. OV in 6 months.  

## 2015-11-28 LAB — HEPATIC FUNCTION PANEL
ALBUMIN: 3.9 g/dL (ref 3.6–5.1)
ALK PHOS: 60 U/L (ref 40–115)
ALT: 23 U/L (ref 9–46)
AST: 23 U/L (ref 10–35)
BILIRUBIN INDIRECT: 0.5 mg/dL (ref 0.2–1.2)
BILIRUBIN TOTAL: 0.6 mg/dL (ref 0.2–1.2)
Bilirubin, Direct: 0.1 mg/dL (ref <=0.2)
Total Protein: 6.7 g/dL (ref 6.1–8.1)

## 2015-11-28 LAB — HEPATITIS C RNA QUANTITATIVE: HCV Quantitative: NOT DETECTED IU/mL (ref <15)

## 2015-12-01 ENCOUNTER — Ambulatory Visit (HOSPITAL_COMMUNITY): Payer: Medicaid Other

## 2015-12-03 ENCOUNTER — Ambulatory Visit (HOSPITAL_COMMUNITY)
Admission: RE | Admit: 2015-12-03 | Discharge: 2015-12-03 | Disposition: A | Discharge status: Home / Self Care | Payer: Medicaid Other | Source: Ambulatory Visit | Attending: Internal Medicine | Admitting: Internal Medicine

## 2015-12-03 DIAGNOSIS — B1920 Unspecified viral hepatitis C without hepatic coma: Secondary | ICD-10-CM | POA: Diagnosis present

## 2015-12-04 ENCOUNTER — Other Ambulatory Visit (HOSPITAL_COMMUNITY): Payer: Self-pay | Admitting: Neurosurgery

## 2015-12-04 DIAGNOSIS — S32001A Stable burst fracture of unspecified lumbar vertebra, initial encounter for closed fracture: Secondary | ICD-10-CM

## 2015-12-10 ENCOUNTER — Ambulatory Visit (HOSPITAL_COMMUNITY): Payer: Medicaid Other | Attending: Neurosurgery

## 2015-12-19 ENCOUNTER — Other Ambulatory Visit (HOSPITAL_COMMUNITY): Payer: Self-pay | Admitting: Family Medicine

## 2015-12-19 DIAGNOSIS — M5106 Intervertebral disc disorders with myelopathy, lumbar region: Secondary | ICD-10-CM

## 2015-12-25 ENCOUNTER — Ambulatory Visit (HOSPITAL_COMMUNITY)
Admission: RE | Admit: 2015-12-25 | Discharge: 2015-12-25 | Disposition: A | Discharge status: Home / Self Care | Payer: Medicaid Other | Source: Ambulatory Visit | Attending: Family Medicine | Admitting: Family Medicine

## 2015-12-25 DIAGNOSIS — M47896 Other spondylosis, lumbar region: Secondary | ICD-10-CM | POA: Insufficient documentation

## 2015-12-25 DIAGNOSIS — M5106 Intervertebral disc disorders with myelopathy, lumbar region: Secondary | ICD-10-CM | POA: Diagnosis not present

## 2015-12-25 DIAGNOSIS — M4808 Spinal stenosis, sacral and sacrococcygeal region: Secondary | ICD-10-CM | POA: Insufficient documentation

## 2016-11-03 ENCOUNTER — Encounter (INDEPENDENT_AMBULATORY_CARE_PROVIDER_SITE_OTHER): Payer: Self-pay | Admitting: Internal Medicine

## 2016-11-04 ENCOUNTER — Encounter (INDEPENDENT_AMBULATORY_CARE_PROVIDER_SITE_OTHER): Payer: Self-pay | Admitting: *Deleted

## 2016-11-26 ENCOUNTER — Ambulatory Visit (INDEPENDENT_AMBULATORY_CARE_PROVIDER_SITE_OTHER): Payer: Medicaid Other | Admitting: Internal Medicine

## 2016-12-02 ENCOUNTER — Encounter (INDEPENDENT_AMBULATORY_CARE_PROVIDER_SITE_OTHER): Payer: Self-pay | Admitting: *Deleted

## 2016-12-02 ENCOUNTER — Telehealth (INDEPENDENT_AMBULATORY_CARE_PROVIDER_SITE_OTHER): Payer: Self-pay | Admitting: *Deleted

## 2016-12-02 ENCOUNTER — Other Ambulatory Visit (INDEPENDENT_AMBULATORY_CARE_PROVIDER_SITE_OTHER): Payer: Self-pay | Admitting: Internal Medicine

## 2016-12-02 DIAGNOSIS — Z1211 Encounter for screening for malignant neoplasm of colon: Secondary | ICD-10-CM | POA: Insufficient documentation

## 2016-12-02 NOTE — Telephone Encounter (Signed)
Referring MD/PCP: dondiego   Procedure: tcs w propofol  Reason/Indication:  screening  Has patient had this procedure before?  no  If so, when, by whom and where?    Is there a family history of colon cancer?  no  Who?  What age when diagnosed?    Is patient diabetic?   no      Does patient have prosthetic heart valve or mechanical valve?  no  Do you have a pacemaker?  no  Has patient ever had endocarditis? no  Has patient had joint replacement within last 12 months?  no  Does patient tend to be constipated or take laxatives? no  Does patient have a history of alcohol/drug use?  Years ago  Is patient on Coumadin, Plavix and/or Aspirin? no  Medications: oxycodone 10 mg 1 tab every 4 hours, gabapentin 300 mg tid  Allergies: nkda  Medication Adjustment per Dr Laural Golden:   Procedure date & time: 12/31/16 at 730 preop 5/7 @ 10

## 2016-12-02 NOTE — Telephone Encounter (Signed)
Patient needs trilyte 

## 2016-12-06 ENCOUNTER — Ambulatory Visit (INDEPENDENT_AMBULATORY_CARE_PROVIDER_SITE_OTHER): Payer: Medicaid Other | Admitting: Internal Medicine

## 2016-12-06 ENCOUNTER — Other Ambulatory Visit (HOSPITAL_COMMUNITY): Payer: Self-pay | Admitting: Family Medicine

## 2016-12-06 ENCOUNTER — Ambulatory Visit (HOSPITAL_COMMUNITY)
Admission: RE | Admit: 2016-12-06 | Discharge: 2016-12-06 | Disposition: A | Discharge status: Home / Self Care | Payer: Medicaid Other | Source: Ambulatory Visit | Attending: Family Medicine | Admitting: Family Medicine

## 2016-12-06 DIAGNOSIS — X58XXXA Exposure to other specified factors, initial encounter: Secondary | ICD-10-CM | POA: Diagnosis not present

## 2016-12-06 DIAGNOSIS — T148XXA Other injury of unspecified body region, initial encounter: Secondary | ICD-10-CM

## 2016-12-06 DIAGNOSIS — S82402A Unspecified fracture of shaft of left fibula, initial encounter for closed fracture: Secondary | ICD-10-CM | POA: Diagnosis not present

## 2016-12-06 DIAGNOSIS — S93402A Sprain of unspecified ligament of left ankle, initial encounter: Secondary | ICD-10-CM | POA: Diagnosis present

## 2016-12-06 MED ORDER — PEG 3350-KCL-NA BICARB-NACL 420 G PO SOLR: 4000.0000 mL | Freq: Once | ORAL | 0 refills | Status: AC

## 2016-12-06 NOTE — Telephone Encounter (Signed)
agree

## 2016-12-09 ENCOUNTER — Encounter (INDEPENDENT_AMBULATORY_CARE_PROVIDER_SITE_OTHER): Payer: Self-pay | Admitting: Internal Medicine

## 2016-12-09 ENCOUNTER — Ambulatory Visit (INDEPENDENT_AMBULATORY_CARE_PROVIDER_SITE_OTHER): Payer: Medicaid Other | Admitting: Internal Medicine

## 2016-12-09 VITALS — BP 130/76 | HR 60 | Temp 97.8°F | Ht 69.0 in | Wt 151.3 lb

## 2016-12-09 DIAGNOSIS — B182 Chronic viral hepatitis C: Secondary | ICD-10-CM

## 2016-12-09 NOTE — Patient Instructions (Signed)
OV in 1 year.  

## 2016-12-09 NOTE — Progress Notes (Signed)
   Subjective:    Patient ID: Curtis Baldwin, male    DOB: Dec 04, 1960, 56 y.o.   MRN: [id number]  HPI Here today for f/u. Last seen April 2017. Hx of Hepatitis C. Genotype 1A.  Completed tx in 2016 with Viekira and Ribavirin 100mg  x 12 weeks. He cleared the virus. He states he is doing good. He twisted his left ankle and has appt to see Dr. Cindie Laroche.  His appetite is good. He has gained about 6 pounds. He usually has a BM daily. No melena or BRRB.  11/27/2015 Hep C quaint: not detected.  12/03/2015 [place] RUQ: Hepatitis C  Liver: No focal lesion identified. Within normal limits in parenchymal echogenicity.   Review of Systems Past Medical History:  Diagnosis Date  . Arthritis   . Hepatitis C     Past Surgical History:  Procedure Laterality Date  . ANKLE FRACTURE SURGERY     right with pins  . FACIAL FRACTURE SURGERY      No Known Allergies  Current Outpatient Prescriptions on File Prior to Visit  Medication Sig Dispense Refill  . acetaminophen-codeine (TYLENOL #3) 300-30 MG per tablet Take 1-2 tablets by mouth every 6 (six) hours as needed for moderate pain. 20 tablet 0  . HYDROcodone-acetaminophen (NORCO/VICODIN) 5-325 MG per tablet Take 1 tablet by mouth every 6 (six) hours as needed for moderate pain.    Marland Kitchen ibuprofen (ADVIL,MOTRIN) 800 MG tablet Take 1 tablet (800 mg total) by mouth 3 (three) times daily. 21 tablet 0  . methocarbamol (ROBAXIN) 500 MG tablet Take 1 tablet (500 mg total) by mouth 3 (three) times daily. 21 tablet 0  . oxyCODONE-acetaminophen (PERCOCET/ROXICET) 5-325 MG per tablet Take 1 tablet by mouth every 4 (four) hours as needed. 20 tablet 0   No current facility-administered medications on file prior to visit.        Objective:   Physical Exam Blood pressure 130/76, pulse 60, temperature 97.8 F (36.6 C), height 5\' 9"  (1.753 m), weight 151 lb 4.8 oz (68.6 kg).  Alert and oriented. Skin warm and dry. Oral mucosa is moist.   . Sclera anicteric, conjunctivae  is pink. Thyroid not enlarged. No cervical lymphadenopathy. Lungs clear. Heart regular rate and rhythm.  Abdomen is soft. Bowel sounds are positive. No hepatomegaly. No abdominal masses felt. No tenderness.  1 + edema to left lower leg from a fall while mowing last week.      Assessment & Plan:  Hepatitis C.  [place] RUQ. AFP, Hep C quaint.  OV in 1 year.

## 2016-12-10 ENCOUNTER — Encounter: Payer: Self-pay | Admitting: Orthopedic Surgery

## 2016-12-10 ENCOUNTER — Ambulatory Visit (INDEPENDENT_AMBULATORY_CARE_PROVIDER_SITE_OTHER): Payer: Medicaid Other | Admitting: Orthopedic Surgery

## 2016-12-10 VITALS — BP 134/75 | HR 66 | Ht 71.0 in | Wt 155.0 lb

## 2016-12-10 DIAGNOSIS — S82822A Torus fracture of lower end of left fibula, initial encounter for closed fracture: Secondary | ICD-10-CM

## 2016-12-10 NOTE — Progress Notes (Signed)
134 

## 2016-12-10 NOTE — Progress Notes (Signed)
Patient ID: Curtis Baldwin, male   DOB: 1961-05-17, 57 y.o.   MRN: [id number]  Chief Complaint  Patient presents with  . Fracture    left distal fibula fracture, DOI 12/05/16    HPI Curtis Baldwin is a 56 y.o. male.  56 year old male fell on the 13th went to the hospital on the 16th. He complains of one-week history of mild to moderate aching pain over the lateral fibula with swelling of the left foot some trouble weightbearing  Review of Systems Review of Systems (2 MINIMUM) History of chronic pain  Arthralgias  No fever   Past Medical History:  Diagnosis Date  . Arthritis   . Hepatitis C     Past Surgical History:  Procedure Laterality Date  . ANKLE FRACTURE SURGERY     right with pins  . FACIAL FRACTURE SURGERY      Social History Social History  Substance Use Topics  . Smoking status: Current Every Day Smoker    Packs/day: 0.50    Types: Cigarettes  . Smokeless tobacco: Never Used     Comment: 1/2 pack a day  . Alcohol use No     Comment: former    No Known Allergies  Current Meds  Medication Sig  . gabapentin (NEURONTIN) 300 MG capsule Take 300 mg by mouth 3 (three) times daily.  . OxyCODONE HCl (OXYCONTIN PO) Take by mouth.      Physical Exam Physical Exam 1.BP 134/75   Pulse 66   Ht 5\' 11"  (1.803 m)   Wt 155 lb (70.3 kg)   BMI 21.62 kg/m   2. Gen. appearance. The patient is well-developed and well-nourished, grooming and hygiene are normal. There are no gross congenital abnormalities  3. The patient is alert and oriented to person place and time  4. Mood and affect are normal  5. Ambulation Antalgic gait with a cane  Examination reveals the following: 6. On inspection we find swelling of the left foot with tenderness over the fibula  Decreased range of motion. We deferred stability test because of pain there was no muscle atrophy skin was erythematous and hyper reverse sensation was intact pulses were normal   Right lower extremity and  ankle no swelling or tenderness  MEDICAL DECISION MAKING:    Data Reviewed Plain film done at the hospital included 3 views of the foot and ankle   The ankle mortise is intact there is a nondisplaced fibular fracture  Foot x-ray was normal Assessment Distal fibular fracture Encounter Diagnosis  Name Primary?  . Closed torus fracture of distal end of left fibula, initial encounter Yes     Plan Continue Aircast 8 weeks then x-ray  Arther Abbott 12/10/2016, 10:05 AM

## 2016-12-14 ENCOUNTER — Ambulatory Visit (HOSPITAL_COMMUNITY): Payer: Medicaid Other

## 2016-12-22 NOTE — Patient Instructions (Signed)
Curtis Baldwin  12/22/2016     @PREFPERIOPPHARMACY @   Your procedure is scheduled on 12/31/2016.  Report to [facility] at 6:15 A.M.  Call this number if you have problems the morning of surgery:  [phone]   Remember:  Do not eat food or drink liquids after midnight.  Take these medicines the morning of surgery with A SIP OF WATER Neurontin, Oxycodone if needed   Do not wear jewelry, make-up or nail polish.  Do not wear lotions, powders, or perfumes, or deoderant.  Do not shave 48 hours prior to surgery.  Men may shave face and neck.  Do not bring valuables to the hospital.  [facility] is not responsible for any belongings or valuables.  Contacts, dentures or bridgework may not be worn into surgery.  Leave your suitcase in the car.  After surgery it may be brought to your room.  For patients admitted to the hospital, discharge time will be determined by your treatment team.  Patients discharged the day of surgery will not be allowed to drive home.    Please read over the following fact sheets that you were given. Anesthesia Post-op Instructions     PATIENT INSTRUCTIONS POST-ANESTHESIA  IMMEDIATELY FOLLOWING SURGERY:  Do not drive or operate machinery for the first twenty four hours after surgery.  Do not make any important decisions for twenty four hours after surgery or while taking narcotic pain medications or sedatives.  If you develop intractable nausea and vomiting or a severe headache please notify your doctor immediately.  FOLLOW-UP:  Please make an appointment with your surgeon as instructed. You do not need to follow up with anesthesia unless specifically instructed to do so.  WOUND CARE INSTRUCTIONS (if applicable):  Keep a dry clean dressing on the anesthesia/puncture wound site if there is drainage.  Once the wound has quit draining you may leave it open to air.  Generally you should leave the bandage intact for twenty four hours unless there is drainage.  If  the epidural site drains for more than 36-48 hours please call the anesthesia department.  QUESTIONS?:  Please feel free to call your physician or the hospital operator if you have any questions, and they will be happy to assist you.      Colonoscopy, Adult A colonoscopy is an exam to look at the entire large intestine. During the exam, a lubricated, bendable tube is inserted into the anus and then passed into the rectum, colon, and other parts of the large intestine. A colonoscopy is often done as a part of normal colorectal screening or in response to certain symptoms, such as anemia, persistent diarrhea, abdominal pain, and blood in the stool. The exam can help screen for and diagnose medical problems, including:  Tumors.  Polyps.  Inflammation.  Areas of bleeding. Tell a health care provider about:  Any allergies you have.  All medicines you are taking, including vitamins, herbs, eye drops, creams, and over-the-counter medicines.  Any problems you or family members have had with anesthetic medicines.  Any blood disorders you have.  Any surgeries you have had.  Any medical conditions you have.  Any problems you have had passing stool. What are the risks? Generally, this is a safe procedure. However, problems may occur, including:  Bleeding.  A tear in the intestine.  A reaction to medicines given during the exam.  Infection (rare). What happens before the procedure? Eating and drinking restrictions  Follow instructions from your health care provider about  eating and drinking, which may include:  A few days before the procedure - follow a low-fiber diet. Avoid nuts, seeds, dried fruit, raw fruits, and vegetables.  1-3 days before the procedure - follow a clear liquid diet. Drink only clear liquids, such as clear broth or bouillon, black coffee or tea, clear juice, clear soft drinks or sports drinks, gelatin dessert, and popsicles. Avoid any liquids that contain red or  purple dye.  On the day of the procedure - do not eat or drink anything during the 2 hours before the procedure, or within the time period that your health care provider recommends. Bowel prep  If you were prescribed an oral bowel prep to clean out your colon:  Take it as told by your health care provider. Starting the day before your procedure, you will need to drink a large amount of medicated liquid. The liquid will cause you to have multiple loose stools until your stool is almost clear or light green.  If your skin or anus gets irritated from diarrhea, you may use these to relieve the irritation:  Medicated wipes, such as adult wet wipes with aloe and vitamin E.  A skin soothing-product like petroleum jelly.  If you vomit while drinking the bowel prep, take a break for up to 60 minutes and then begin the bowel prep again. If vomiting continues and you cannot take the bowel prep without vomiting, call your health care provider. General instructions   Ask your health care provider about changing or stopping your regular medicines. This is especially important if you are taking diabetes medicines or blood thinners.  Plan to have someone take you home from the hospital or clinic. What happens during the procedure?  An IV tube may be inserted into one of your veins.  You will be given medicine to help you relax (sedative).  To reduce your risk of infection:  Your health care team will wash or sanitize their hands.  Your anal area will be washed with soap.  You will be asked to lie on your side with your knees bent.  Your health care provider will lubricate a long, thin, flexible tube. The tube will have a camera and a light on the end.  The tube will be inserted into your anus.  The tube will be gently eased through your rectum and colon.  Air will be delivered into your colon to keep it open. You may feel some pressure or cramping.  The camera will be used to take images  during the procedure.  A small tissue sample may be removed from your body to be examined under a microscope (biopsy). If any potential problems are found, the tissue will be sent to a lab for testing.  If small polyps are found, your health care provider may remove them and have them checked for cancer cells.  The tube that was inserted into your anus will be slowly removed. The procedure may vary among health care providers and hospitals. What happens after the procedure?  Your blood pressure, heart rate, breathing rate, and blood oxygen level will be monitored until the medicines you were given have worn off.  Do not drive for 24 hours after the exam.  You may have a small amount of blood in your stool.  You may pass gas and have mild abdominal cramping or bloating due to the air that was used to inflate your colon during the exam.  It is up to you to get the results  of your procedure. Ask your health care provider, or the department performing the procedure, when your results will be ready. This information is not intended to replace advice given to you by your health care provider. Make sure you discuss any questions you have with your health care provider. Document Released: 08/06/2000 Document Revised: 06/09/2016 Document Reviewed: 10/21/2015 Elsevier Interactive Patient Education  2017 [employer].

## 2016-12-23 ENCOUNTER — Ambulatory Visit (HOSPITAL_COMMUNITY)
Admission: RE | Admit: 2016-12-23 | Discharge: 2016-12-23 | Disposition: A | Discharge status: Home / Self Care | Payer: Medicaid Other | Source: Ambulatory Visit | Attending: Internal Medicine | Admitting: Internal Medicine

## 2016-12-23 DIAGNOSIS — B182 Chronic viral hepatitis C: Secondary | ICD-10-CM | POA: Insufficient documentation

## 2016-12-24 LAB — AFP TUMOR MARKER: AFP TUMOR MARKER: 1.2 ng/mL (ref <6.1)

## 2016-12-25 LAB — HEPATITIS C RNA QUANTITATIVE
HCV QUANT LOG: NOT DETECTED {Log_IU}/mL
HCV QUANT: NOT DETECTED [IU]/mL

## 2016-12-27 ENCOUNTER — Encounter (HOSPITAL_COMMUNITY): Payer: Self-pay

## 2016-12-27 ENCOUNTER — Encounter (HOSPITAL_COMMUNITY)
Admission: RE | Admit: 2016-12-27 | Discharge: 2016-12-27 | Disposition: A | Discharge status: Home / Self Care | Payer: Medicaid Other | Source: Ambulatory Visit | Attending: Internal Medicine | Admitting: Internal Medicine

## 2016-12-27 ENCOUNTER — Other Ambulatory Visit: Payer: Self-pay

## 2016-12-27 DIAGNOSIS — Z01818 Encounter for other preprocedural examination: Secondary | ICD-10-CM | POA: Diagnosis present

## 2016-12-27 DIAGNOSIS — Z1211 Encounter for screening for malignant neoplasm of colon: Secondary | ICD-10-CM

## 2016-12-27 HISTORY — DX: Other chronic pain: G89.29

## 2016-12-27 LAB — CBC WITH DIFFERENTIAL/PLATELET
BASOS ABS: 0.1 10*3/uL (ref 0.0–0.1)
Basophils Relative: 1 %
EOS ABS: 0.4 10*3/uL (ref 0.0–0.7)
Eosinophils Relative: 3 %
HCT: 46.1 % (ref 39.0–52.0)
HEMOGLOBIN: 15.5 g/dL (ref 13.0–17.0)
Lymphocytes Relative: 32 %
Lymphs Abs: 3.9 10*3/uL (ref 0.7–4.0)
MCH: 32 pg (ref 26.0–34.0)
MCHC: 33.6 g/dL (ref 30.0–36.0)
MCV: 95.2 fL (ref 78.0–100.0)
MONOS PCT: 11 %
Monocytes Absolute: 1.3 10*3/uL — ABNORMAL HIGH (ref 0.1–1.0)
NEUTROS ABS: 6.8 10*3/uL (ref 1.7–7.7)
NEUTROS PCT: 53 %
Platelets: 366 10*3/uL (ref 150–400)
RBC: 4.84 MIL/uL (ref 4.22–5.81)
RDW: 12.4 % (ref 11.5–15.5)
WBC: 12.4 10*3/uL — AB (ref 4.0–10.5)

## 2016-12-27 LAB — BASIC METABOLIC PANEL
ANION GAP: 6 (ref 5–15)
BUN: 20 mg/dL (ref 6–20)
CALCIUM: 9.3 mg/dL (ref 8.9–10.3)
CHLORIDE: 105 mmol/L (ref 101–111)
CO2: 27 mmol/L (ref 22–32)
CREATININE: 0.86 mg/dL (ref 0.61–1.24)
GFR calc non Af Amer: >60 mL/min (ref >60)
Glucose, Bld: 73 mg/dL (ref 65–99)
Potassium: 4.2 mmol/L (ref 3.5–5.1)
SODIUM: 138 mmol/L (ref 135–145)

## 2016-12-31 ENCOUNTER — Ambulatory Visit (HOSPITAL_COMMUNITY): Payer: Medicaid Other | Admitting: Anesthesiology

## 2016-12-31 ENCOUNTER — Encounter (HOSPITAL_COMMUNITY): Payer: Self-pay | Admitting: *Deleted

## 2016-12-31 ENCOUNTER — Encounter (HOSPITAL_COMMUNITY)
Admission: RE | Disposition: A | Discharge status: Home / Self Care | Payer: Self-pay | Source: Ambulatory Visit | Attending: Internal Medicine

## 2016-12-31 ENCOUNTER — Ambulatory Visit (HOSPITAL_COMMUNITY)
Admission: RE | Admit: 2016-12-31 | Discharge: 2016-12-31 | Disposition: A | Discharge status: Home / Self Care | Payer: Medicaid Other | Source: Ambulatory Visit | Attending: Internal Medicine | Admitting: Internal Medicine

## 2016-12-31 DIAGNOSIS — B1920 Unspecified viral hepatitis C without hepatic coma: Secondary | ICD-10-CM | POA: Insufficient documentation

## 2016-12-31 DIAGNOSIS — Z79899 Other long term (current) drug therapy: Secondary | ICD-10-CM | POA: Diagnosis not present

## 2016-12-31 DIAGNOSIS — D125 Benign neoplasm of sigmoid colon: Secondary | ICD-10-CM | POA: Insufficient documentation

## 2016-12-31 DIAGNOSIS — F17210 Nicotine dependence, cigarettes, uncomplicated: Secondary | ICD-10-CM | POA: Diagnosis not present

## 2016-12-31 DIAGNOSIS — Z1211 Encounter for screening for malignant neoplasm of colon: Secondary | ICD-10-CM | POA: Diagnosis not present

## 2016-12-31 DIAGNOSIS — K648 Other hemorrhoids: Secondary | ICD-10-CM | POA: Diagnosis not present

## 2016-12-31 DIAGNOSIS — G8929 Other chronic pain: Secondary | ICD-10-CM | POA: Insufficient documentation

## 2016-12-31 DIAGNOSIS — M1990 Unspecified osteoarthritis, unspecified site: Secondary | ICD-10-CM | POA: Diagnosis not present

## 2016-12-31 HISTORY — PX: COLONOSCOPY WITH PROPOFOL: SHX5780

## 2016-12-31 HISTORY — PX: POLYPECTOMY: SHX5525

## 2016-12-31 SURGERY — COLONOSCOPY WITH PROPOFOL
Anesthesia: Monitor Anesthesia Care

## 2016-12-31 MED ORDER — PROPOFOL 10 MG/ML IV BOLUS
INTRAVENOUS | Status: DC | PRN
  Administered 2016-12-31: 10 mg via INTRAVENOUS
  Administered 2016-12-31: 20 mg via INTRAVENOUS
  Administered 2016-12-31: 30 mg via INTRAVENOUS

## 2016-12-31 MED ORDER — PROPOFOL 10 MG/ML IV BOLUS
INTRAVENOUS | Status: AC
  Filled 2016-12-31: qty 40

## 2016-12-31 MED ORDER — PROPOFOL 500 MG/50ML IV EMUL
INTRAVENOUS | Status: DC | PRN
  Administered 2016-12-31: 08:00:00 via INTRAVENOUS
  Administered 2016-12-31: 150 ug/kg/min via INTRAVENOUS

## 2016-12-31 MED ORDER — MIDAZOLAM HCL 2 MG/2ML IJ SOLN
INTRAMUSCULAR | Status: AC
  Filled 2016-12-31: qty 2

## 2016-12-31 MED ORDER — LACTATED RINGERS IV SOLN
INTRAVENOUS | Status: DC
  Administered 2016-12-31: 07:00:00 via INTRAVENOUS

## 2016-12-31 MED ORDER — CHLORHEXIDINE GLUCONATE CLOTH 2 % EX PADS: 6.0000 | MEDICATED_PAD | Freq: Once | CUTANEOUS | Status: DC

## 2016-12-31 MED ORDER — FENTANYL CITRATE (PF) 100 MCG/2ML IJ SOLN
25.0000 ug | INTRAMUSCULAR | Status: AC | PRN
  Administered 2016-12-31 (×2): 25 ug via INTRAVENOUS

## 2016-12-31 MED ORDER — FENTANYL CITRATE (PF) 100 MCG/2ML IJ SOLN
INTRAMUSCULAR | Status: AC
Start: 2016-12-31 — End: ?
  Filled 2016-12-31: qty 2

## 2016-12-31 MED ORDER — MIDAZOLAM HCL 5 MG/5ML IJ SOLN
INTRAMUSCULAR | Status: DC | PRN
  Administered 2016-12-31: 2 mg via INTRAVENOUS

## 2016-12-31 MED ORDER — LIDOCAINE HCL (PF) 1 % IJ SOLN
INTRAMUSCULAR | Status: AC
  Filled 2016-12-31: qty 5

## 2016-12-31 MED ORDER — IPRATROPIUM-ALBUTEROL 0.5-2.5 (3) MG/3ML IN SOLN
3.0000 mL | Freq: Once | RESPIRATORY_TRACT | Status: AC
  Administered 2016-12-31: 3 mL via RESPIRATORY_TRACT

## 2016-12-31 MED ORDER — IPRATROPIUM-ALBUTEROL 0.5-2.5 (3) MG/3ML IN SOLN
RESPIRATORY_TRACT | Status: AC
  Filled 2016-12-31: qty 3

## 2016-12-31 MED ORDER — MIDAZOLAM HCL 2 MG/2ML IJ SOLN
1.0000 mg | INTRAMUSCULAR | Status: DC | PRN
  Administered 2016-12-31: 2 mg via INTRAVENOUS

## 2016-12-31 NOTE — Transfer of Care (Signed)
Immediate Anesthesia Transfer of Care Note  Patient: Curtis Baldwin  Procedure(s) Performed: Procedure(s) with comments: COLONOSCOPY WITH PROPOFOL (N/A) - 7:30 POLYPECTOMY - colon  Patient Location: PACU  Anesthesia Type:MAC  Level of Consciousness: awake and alert   Airway & Oxygen Therapy: Patient Spontanous Breathing  Post-op Assessment: Report given to RN  Post vital signs: Reviewed and stable  Last Vitals:  Vitals:   12/31/16 0724 12/31/16 0807  BP: 109/64 92/60  Pulse:  83  Resp: (!) 24 20  Temp:  36.8 C    Last Pain:  Vitals:   12/31/16 0625  TempSrc: Oral      Patients Stated Pain Goal: 6 (60/67/70 3403)  Complications: No apparent anesthesia complications

## 2016-12-31 NOTE — Op Note (Addendum)
[facility] Patient Name: Curtis Baldwin Procedure Date: 12/31/2016 7:14 AM MRN: [id number] Date of Birth: 26-Sep-1960 Attending MD: Hildred Laser , MD CSN: [id number] Age: 56 Admit Type: Outpatient Procedure:                Colonoscopy Indications:              Screening for colorectal malignant neoplasm Providers:                Hildred Laser, MD, Otis Peak B. Sharon Seller, RN, Randa Spike, Technician Referring MD:             Lucia Gaskins, MD Medicines:                Propofol per Anesthesia Complications:            No immediate complications. Estimated Blood Loss:     Estimated blood loss was minimal. Procedure:                Pre-Anesthesia Assessment:                           - Prior to the procedure, a History and Physical                            was performed, and patient medications and                            allergies were reviewed. The patient's tolerance of                            previous anesthesia was also reviewed. The risks                            and benefits of the procedure and the sedation                            options and risks were discussed with the patient.                            All questions were answered, and informed consent                            was obtained. Prior Anticoagulants: The patient                            last took naproxen on the day of the procedure. ASA                            Grade Assessment: III - A patient with severe                            systemic disease. After reviewing the risks and  benefits, the patient was deemed in satisfactory                            condition to undergo the procedure.                           After obtaining informed consent, the colonoscope                            was passed under direct vision. Throughout the                            procedure, the patient's blood pressure, pulse, and        oxygen saturations were monitored continuously. The                            EC-[id number] ([id number]) scope was introduced through                            the anus and advanced to the the cecum, identified                            by appendiceal orifice and ileocecal valve. The                            colonoscopy was performed without difficulty. The                            patient tolerated the procedure well. The quality                            of the bowel preparation was good. The ileocecal                            valve, appendiceal orifice, and rectum were                            photographed. Scope In: 7:40:47 AM Scope Out: 7:59:24 AM Scope Withdrawal Time: 0 hours 12 minutes 20 seconds  Total Procedure Duration: 0 hours 18 minutes 37 seconds  Findings:      The perianal and digital rectal examinations were normal.      The descending colon, splenic flexure, transverse colon, hepatic       flexure, ascending colon, cecum, appendiceal orifice and ileocecal valve       appeared normal.      A 4 mm polyp was found in the distal sigmoid colon. The polyp was       sessile. The polyp was removed with a cold snare. Resection and       retrieval were complete.      Internal hemorrhoids were found during retroflexion. The hemorrhoids       were small. Impression:               - The descending colon, splenic flexure, transverse  colon, hepatic flexure, ascending colon, cecum,                            appendiceal orifice and ileocecal valve are normal.                           - One 4 mm polyp in the distal sigmoid colon,                            removed with a cold snare. Resected and retrieved.                           - Internal hemorrhoids. Moderate Sedation:      Per Anesthesia Care Recommendation:           - Patient has a contact number available for                            emergencies. The signs and symptoms of potential                             delayed complications were discussed with the                            patient. Return to normal activities tomorrow.                            Written discharge instructions were provided to the                            patient.                           - Resume previous diet today.                           - Continue present medications.                           - Await pathology results.                           - Repeat colonoscopy date to be determined after                            pending pathology results are reviewed. Procedure Code(s):        --- Professional ---                           [phone], Colonoscopy, flexible; with removal of                            tumor(s), polyp(s), or other lesion(s) by snare                            technique Diagnosis Code(s):        ---  Professional ---                           Z12.11, Encounter for screening for malignant                            neoplasm of colon                           K64.8, Other hemorrhoids                           D12.5, Benign neoplasm of sigmoid colon CPT copyright 2016 American Medical Association. All rights reserved. The codes documented in this report are preliminary and upon coder review may  be revised to meet current compliance requirements. Hildred Laser, MD Hildred Laser, MD 12/31/2016 8:09:23 AM This report has been signed electronically. Number of Addenda: 0

## 2016-12-31 NOTE — Discharge Instructions (Signed)
Resume usual medications and diet. No driving for 24 hours. Physician will call with biopsy results.   Colonoscopy, Adult, Care After This sheet gives you information about how to care for yourself after your procedure. Your health care provider may also give you more specific instructions. If you have problems or questions, contact your health care provider. What can I expect after the procedure? After the procedure, it is common to have:  A small amount of blood in your stool for 24 hours after the procedure.  Some gas.  Mild abdominal cramping or bloating. Follow these instructions at home: General instructions    For the first 24 hours after the procedure:  Do not drive or use machinery.  Do not sign important documents.  Do not drink alcohol.  Do your regular daily activities at a slower pace than normal.  Eat soft, easy-to-digest foods.  Rest often.  Take over-the-counter or prescription medicines only as told by your health care provider.  It is up to you to get the results of your procedure. Ask your health care provider, or the department performing the procedure, when your results will be ready. Relieving cramping and bloating   Try walking around when you have cramps or feel bloated.  Apply heat to your abdomen as told by your health care provider. Use a heat source that your health care provider recommends, such as a moist heat pack or a heating pad.  Place a towel between your skin and the heat source.  Leave the heat on for 20-30 minutes.  Remove the heat if your skin turns bright red. This is especially important if you are unable to feel pain, heat, or cold. You may have a greater risk of getting burned. Eating and drinking   Drink enough fluid to keep your urine clear or pale yellow.  Resume your normal diet as instructed by your health care provider. Avoid heavy or fried foods that are hard to digest.  Avoid drinking alcohol for as long as  instructed by your health care provider. Contact a health care provider if:  You have blood in your stool 2-3 days after the procedure. Get help right away if:  You have more than a small spotting of blood in your stool.  You pass large blood clots in your stool.  Your abdomen is swollen.  You have nausea or vomiting.  You have a fever.  You have increasing abdominal pain that is not relieved with medicine. This information is not intended to replace advice given to you by your health care provider. Make sure you discuss any questions you have with your health care provider. Document Released: 03/23/2004 Document Revised: 05/03/2016 Document Reviewed: 10/21/2015 Elsevier Interactive Patient Education  2017 [place].   Colon Polyps Polyps are tissue growths inside the body. Polyps can grow in many places, including the large intestine (colon). A polyp may be a round bump or a mushroom-shaped growth. You could have one polyp or several. Most colon polyps are noncancerous (benign). However, some colon polyps can become cancerous over time. What are the causes? The exact cause of colon polyps is not known. What increases the risk? This condition is more likely to develop in people who:  Have a family history of colon cancer or colon polyps.  Are older than 73 or older than 45 if they are African American.  Have inflammatory bowel disease, such as ulcerative colitis or Crohn disease.  Are overweight.  Smoke cigarettes.  Do not get enough  exercise.  Drink too much alcohol.  Eat a diet that is:  High in fat and red meat.  Low in fiber.  Had childhood cancer that was treated with abdominal radiation. What are the signs or symptoms? Most polyps do not cause symptoms. If you have symptoms, they may include:  Blood coming from your rectum when having a bowel movement.  Blood in your stool.The stool may look dark red or black.  A change in bowel habits, such as  constipation or diarrhea. How is this diagnosed? This condition is diagnosed with a colonoscopy. This is a procedure that uses a lighted, flexible scope to look at the inside of your colon. How is this treated? Treatment for this condition involves removing any polyps that are found. Those polyps will then be tested for cancer. If cancer is found, your health care provider will talk to you about options for colon cancer treatment. Follow these instructions at home: Diet   Eat plenty of fiber, such as fruits, vegetables, and whole grains.  Eat foods that are high in calcium and vitamin D, such as milk, cheese, yogurt, eggs, liver, fish, and broccoli.  Limit foods high in fat, red meats, and processed meats, such as hot dogs, sausage, bacon, and lunch meats.  Maintain a healthy weight, or lose weight if recommended by your health care provider. General instructions   Do not smoke cigarettes.  Do not drink alcohol excessively.  Keep all follow-up visits as told by your health care provider. This is important. This includes keeping regularly scheduled colonoscopies. Talk to your health care provider about when you need a colonoscopy.  Exercise every day or as told by your health care provider. Contact a health care provider if:  You have new or worsening bleeding during a bowel movement.  You have new or increased blood in your stool.  You have a change in bowel habits.  You unexpectedly lose weight. This information is not intended to replace advice given to you by your health care provider. Make sure you discuss any questions you have with your health care provider. Document Released: 05/05/2004 Document Revised: 01/15/2016 Document Reviewed: 06/30/2015 Elsevier Interactive Patient Education  2017 [employer].

## 2016-12-31 NOTE — Anesthesia Postprocedure Evaluation (Signed)
Anesthesia Post Note  Patient: Curtis Baldwin  Procedure(s) Performed: Procedure(s) (LRB): COLONOSCOPY WITH PROPOFOL (N/A) POLYPECTOMY  Patient location during evaluation: PACU Anesthesia Type: MAC Level of consciousness: awake and alert and oriented Pain management: pain level controlled Vital Signs Assessment: post-procedure vital signs reviewed and stable Respiratory status: spontaneous breathing Cardiovascular status: blood pressure returned to baseline and stable Postop Assessment: no signs of nausea or vomiting Anesthetic complications: no     Last Vitals:  Vitals:   12/31/16 0807 12/31/16 0815  BP: 92/60 99/63  Pulse: 83 79  Resp: 20 19  Temp: 36.8 C     Last Pain:  Vitals:   12/31/16 0807  TempSrc:   PainSc: 0-No pain                 Dione Petron
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
## 2017-01-04 ENCOUNTER — Encounter (HOSPITAL_COMMUNITY): Payer: Self-pay | Admitting: Internal Medicine

## 2017-02-14 ENCOUNTER — Other Ambulatory Visit: Payer: Medicaid Other

## 2017-02-14 ENCOUNTER — Encounter: Payer: Medicaid Other | Admitting: Orthopedic Surgery

## 2017-06-21 ENCOUNTER — Encounter (INDEPENDENT_AMBULATORY_CARE_PROVIDER_SITE_OTHER): Payer: Self-pay | Admitting: *Deleted

## 2017-06-28 ENCOUNTER — Telehealth (INDEPENDENT_AMBULATORY_CARE_PROVIDER_SITE_OTHER): Payer: Self-pay | Admitting: *Deleted

## 2017-06-28 NOTE — Telephone Encounter (Signed)
Patient is on recall for 6 mth RUQ [place], need order

## 2017-06-29 ENCOUNTER — Other Ambulatory Visit (INDEPENDENT_AMBULATORY_CARE_PROVIDER_SITE_OTHER): Payer: Self-pay | Admitting: Internal Medicine

## 2017-06-29 DIAGNOSIS — B182 Chronic viral hepatitis C: Secondary | ICD-10-CM

## 2017-06-29 NOTE — Telephone Encounter (Signed)
Ann, order is in

## 2017-06-29 NOTE — Telephone Encounter (Signed)
[place] sch'd 07/05/17, patient aware

## 2017-07-05 ENCOUNTER — Ambulatory Visit (HOSPITAL_COMMUNITY)
Admission: RE | Admit: 2017-07-05 | Discharge: 2017-07-05 | Disposition: A | Discharge status: Home / Self Care | Payer: Medicaid Other | Source: Ambulatory Visit | Attending: Internal Medicine | Admitting: Internal Medicine

## 2017-07-05 DIAGNOSIS — B182 Chronic viral hepatitis C: Secondary | ICD-10-CM | POA: Diagnosis present

## 2017-12-15 ENCOUNTER — Encounter (INDEPENDENT_AMBULATORY_CARE_PROVIDER_SITE_OTHER): Payer: Self-pay | Admitting: *Deleted

## 2017-12-29 ENCOUNTER — Telehealth (INDEPENDENT_AMBULATORY_CARE_PROVIDER_SITE_OTHER): Payer: Self-pay | Admitting: *Deleted

## 2017-12-29 NOTE — Telephone Encounter (Signed)
Patient is on recall for 6 mth RUQ [place], need order please

## 2017-12-30 ENCOUNTER — Other Ambulatory Visit (INDEPENDENT_AMBULATORY_CARE_PROVIDER_SITE_OTHER): Payer: Self-pay | Admitting: Internal Medicine

## 2017-12-30 DIAGNOSIS — B182 Chronic viral hepatitis C: Secondary | ICD-10-CM

## 2017-12-30 NOTE — Telephone Encounter (Signed)
ordered

## 2017-12-30 NOTE — Telephone Encounter (Signed)
[place] sch'd 01/05/18 at 1130 (11:15), npo after midnight, patient aware

## 2018-01-05 ENCOUNTER — Ambulatory Visit (HOSPITAL_COMMUNITY)
Admission: RE | Admit: 2018-01-05 | Discharge: 2018-01-05 | Disposition: A | Discharge status: Home / Self Care | Payer: Medicaid Other | Source: Ambulatory Visit | Attending: Internal Medicine | Admitting: Internal Medicine

## 2018-01-05 DIAGNOSIS — B182 Chronic viral hepatitis C: Secondary | ICD-10-CM | POA: Insufficient documentation

## 2018-02-17 ENCOUNTER — Other Ambulatory Visit (HOSPITAL_COMMUNITY): Payer: Self-pay | Admitting: Family Medicine

## 2018-02-17 DIAGNOSIS — M545 Low back pain: Secondary | ICD-10-CM

## 2018-02-23 ENCOUNTER — Encounter (HOSPITAL_COMMUNITY): Payer: Self-pay

## 2018-02-23 ENCOUNTER — Emergency Department (HOSPITAL_COMMUNITY)
Admission: EM | Admit: 2018-02-23 | Discharge: 2018-02-23 | Disposition: A | Discharge status: Home / Self Care | Payer: Medicaid Other | Attending: Emergency Medicine | Admitting: Emergency Medicine

## 2018-02-23 DIAGNOSIS — N5089 Other specified disorders of the male genital organs: Secondary | ICD-10-CM

## 2018-02-23 DIAGNOSIS — Y998 Other external cause status: Secondary | ICD-10-CM | POA: Insufficient documentation

## 2018-02-23 DIAGNOSIS — Y929 Unspecified place or not applicable: Secondary | ICD-10-CM | POA: Insufficient documentation

## 2018-02-23 DIAGNOSIS — S30863A Insect bite (nonvenomous) of scrotum and testes, initial encounter: Secondary | ICD-10-CM | POA: Diagnosis present

## 2018-02-23 DIAGNOSIS — W57XXXA Bitten or stung by nonvenomous insect and other nonvenomous arthropods, initial encounter: Secondary | ICD-10-CM

## 2018-02-23 DIAGNOSIS — Z79899 Other long term (current) drug therapy: Secondary | ICD-10-CM | POA: Diagnosis not present

## 2018-02-23 DIAGNOSIS — F17210 Nicotine dependence, cigarettes, uncomplicated: Secondary | ICD-10-CM | POA: Insufficient documentation

## 2018-02-23 DIAGNOSIS — Y9389 Activity, other specified: Secondary | ICD-10-CM | POA: Diagnosis not present

## 2018-02-23 MED ORDER — DOXYCYCLINE HYCLATE 100 MG PO TABS
100.0000 mg | ORAL_TABLET | Freq: Once | ORAL | Status: AC
  Administered 2018-02-23: 100 mg via ORAL
  Filled 2018-02-23: qty 1

## 2018-02-23 MED ORDER — DOXYCYCLINE HYCLATE 100 MG PO CAPS: 100.0000 mg | ORAL_CAPSULE | Freq: Two times a day (BID) | ORAL | 0 refills | Status: DC

## 2018-02-23 NOTE — ED Triage Notes (Signed)
Pt removed tick from scrotum approx 3 days ago. Pt has swollen and red area to scrotum

## 2018-02-23 NOTE — Discharge Instructions (Signed)
Doxycycline twice a day for 10 days to treat for possible infection.  If you have increased swelling or pain of your scrotum or any fevers with this met your family doctor or the emergency department immediately.
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
## 2018-02-27 ENCOUNTER — Ambulatory Visit (HOSPITAL_COMMUNITY)
Admission: RE | Admit: 2018-02-27 | Discharge: 2018-02-27 | Disposition: A | Discharge status: Home / Self Care | Payer: Medicaid Other | Source: Ambulatory Visit | Attending: Family Medicine | Admitting: Family Medicine

## 2018-02-27 DIAGNOSIS — M47896 Other spondylosis, lumbar region: Secondary | ICD-10-CM | POA: Diagnosis not present

## 2018-02-27 DIAGNOSIS — M48061 Spinal stenosis, lumbar region without neurogenic claudication: Secondary | ICD-10-CM | POA: Insufficient documentation

## 2018-02-27 DIAGNOSIS — M4856XA Collapsed vertebra, not elsewhere classified, lumbar region, initial encounter for fracture: Secondary | ICD-10-CM | POA: Diagnosis not present

## 2018-02-27 DIAGNOSIS — M4316 Spondylolisthesis, lumbar region: Secondary | ICD-10-CM | POA: Diagnosis not present

## 2018-02-27 DIAGNOSIS — M545 Low back pain: Secondary | ICD-10-CM | POA: Diagnosis not present

## 2018-08-21 ENCOUNTER — Emergency Department (HOSPITAL_COMMUNITY)
Admission: EM | Admit: 2018-08-21 | Discharge: 2018-08-21 | Discharge status: Against Medical Advice | Payer: Medicaid Other

## 2018-09-25 ENCOUNTER — Other Ambulatory Visit (HOSPITAL_COMMUNITY): Payer: Self-pay | Admitting: Family Medicine

## 2018-09-25 ENCOUNTER — Ambulatory Visit (HOSPITAL_COMMUNITY)
Admission: RE | Admit: 2018-09-25 | Discharge: 2018-09-25 | Disposition: A | Discharge status: Home / Self Care | Payer: Medicaid Other | Source: Ambulatory Visit | Attending: Family Medicine | Admitting: Family Medicine

## 2018-09-25 DIAGNOSIS — R52 Pain, unspecified: Secondary | ICD-10-CM

## 2018-09-29 DIAGNOSIS — F17200 Nicotine dependence, unspecified, uncomplicated: Secondary | ICD-10-CM | POA: Insufficient documentation

## 2019-02-14 ENCOUNTER — Ambulatory Visit (HOSPITAL_COMMUNITY)
Admission: RE | Admit: 2019-02-14 | Discharge: 2019-02-14 | Disposition: A | Discharge status: Home / Self Care | Payer: Medicaid Other | Source: Ambulatory Visit | Attending: Family Medicine | Admitting: Family Medicine

## 2019-02-14 ENCOUNTER — Other Ambulatory Visit: Payer: Self-pay

## 2019-02-14 ENCOUNTER — Other Ambulatory Visit (HOSPITAL_COMMUNITY): Payer: Self-pay | Admitting: Family Medicine

## 2019-02-14 DIAGNOSIS — M19012 Primary osteoarthritis, left shoulder: Secondary | ICD-10-CM | POA: Insufficient documentation

## 2019-03-21 ENCOUNTER — Other Ambulatory Visit: Payer: Self-pay

## 2019-03-21 ENCOUNTER — Ambulatory Visit: Payer: Medicaid Other | Admitting: Orthopedic Surgery

## 2019-03-21 VITALS — BP 135/76 | HR 72 | Temp 96.8°F | Ht 69.0 in | Wt 146.0 lb

## 2019-03-21 DIAGNOSIS — M7711 Lateral epicondylitis, right elbow: Secondary | ICD-10-CM | POA: Diagnosis not present

## 2019-03-21 DIAGNOSIS — M7542 Impingement syndrome of left shoulder: Secondary | ICD-10-CM | POA: Diagnosis not present

## 2019-03-21 DIAGNOSIS — M7541 Impingement syndrome of right shoulder: Secondary | ICD-10-CM

## 2019-03-21 NOTE — Patient Instructions (Addendum)
You have received an injection of steroids into the joint. 15% of patients will have increased pain within the 24 hours postinjection.   This is transient and will go away.   We recommend that you use ice packs on the injection site for 20 minutes every 2 hours and extra strength Tylenol 2 tablets every 8 as needed until the pain resolves.  If you continue to have pain after taking the Tylenol and using the ice please call the office for further instructions.    Shoulder Impingement Syndrome  Shoulder impingement syndrome is a condition that causes pain when connective tissues (tendons) surrounding the shoulder joint become pinched. These tendons are part of the group of muscles and tissues that help to stabilize the shoulder (rotator cuff). Beneath the rotator cuff is a fluid-filled sac (bursa) that allows the muscles and tendons to glide smoothly. The bursa may become swollen or irritated (bursitis). Bursitis, swelling in the rotator cuff tendons, or both conditions can decrease how much space is under a bone in the shoulder joint (acromion), resulting in impingement. What are the causes? Shoulder impingement syndrome may be caused by bursitis or swelling of the rotator cuff tendons, which may result from:  Repetitive overhead arm movements.  Falling onto the shoulder.  Weakness in the shoulder muscles. What increases the risk? You may be more likely to develop this condition if you:  Play sports that involve throwing, such as baseball.  Participate in sports such as tennis, volleyball, and swimming.  Work as a Curator, Games developer, or Architect. Some people are also more likely to develop impingement syndrome because of the shape of their acromion bone. What are the signs or symptoms? The main symptom of this condition is pain on the front or side of the shoulder. The pain may:  Get worse when lifting or raising the arm.  Get worse at night.  Wake you up from  sleeping.  Feel sharp when the shoulder is moved and then fade to an ache. Other symptoms may include:  Tenderness.  Stiffness.  Inability to raise the arm above shoulder level or behind the body.  Weakness. How is this diagnosed? This condition may be diagnosed based on:  Your symptoms and medical history.  A physical exam.  Imaging tests, such as: ? X-rays. ? MRI. ? Ultrasound. How is this treated? This condition may be treated by:  Resting your shoulder and avoiding all activities that cause pain or put stress on the shoulder.  Icing your shoulder.  NSAIDs to help reduce pain and swelling.  One or more injections of medicines to numb the area and reduce inflammation.  Physical therapy.  Surgery. This may be needed if nonsurgical treatments have not helped. Surgery may involve repairing the rotator cuff, reshaping the acromion, or removing the bursa. Follow these instructions at home: Managing pain, stiffness, and swelling   If directed, put ice on the injured area. ? Put ice in a plastic bag. ? Place a towel between your skin and the bag. ? Leave the ice on for 20 minutes, 2-3 times a day. Activity  Rest and return to your normal activities as told by your health care provider. Ask your health care provider what activities are safe for you.  Do exercises as told by your health care provider. General instructions  Do not use any products that contain nicotine or tobacco, such as cigarettes, e-cigarettes, and chewing tobacco. These can delay healing. If you need help quitting, ask your health care  provider.  Ask your health care provider when it is safe for you to drive.  Take over-the-counter and prescription medicines only as told by your health care provider.  Keep all follow-up visits as told by your health care provider. This is important. How is this prevented?  Give your body time to rest between periods of activity.  Be safe and responsible  while being active. This will help you avoid falls.  Maintain physical fitness, including strength and flexibility. Contact a health care provider if:  Your symptoms have not improved after 1-2 months of treatment and rest.  You cannot lift your arm away from your body. Summary  Shoulder impingement syndrome is a condition that causes pain when connective tissues (tendons) surrounding the shoulder joint become pinched.  The main symptom of this condition is pain on the front or side of the shoulder.  This condition is usually treated with rest, ice, and pain medicines as needed. This information is not intended to replace advice given to you by your health care provider. Make sure you discuss any questions you have with your health care provider. Document Released: 08/09/2005 Document Revised: 12/01/2018 Document Reviewed: 02/01/2018 Elsevier Patient Education  2020 [employer].
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
## 2019-03-25 IMAGING — US US ABDOMEN LIMITED
1 series · 14 of 25 positions shown · non-contrast
Comparison: December 03, 2015

CLINICAL DATA: Hepatitis-C

EXAM:
US ABDOMEN LIMITED - RIGHT UPPER QUADRANT

[Series 1: us abdomen limited · 0.16mm/px · 14 of 55 slices shown]
[im 1/55]
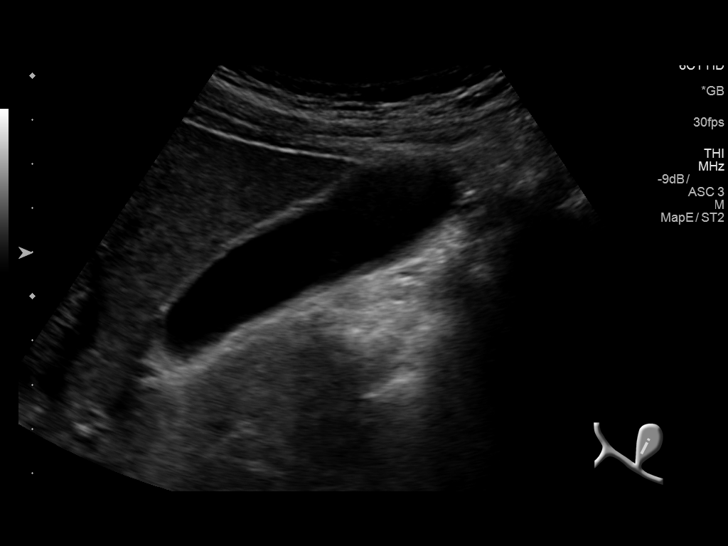
[im 5/55]
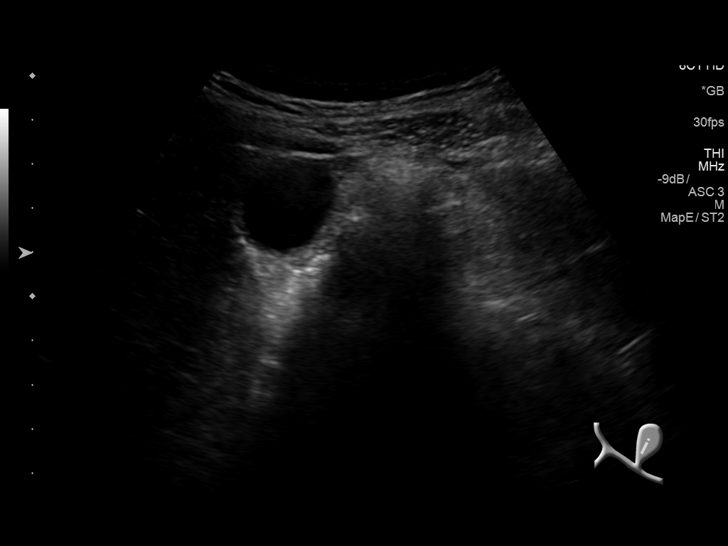
[im 10/55]
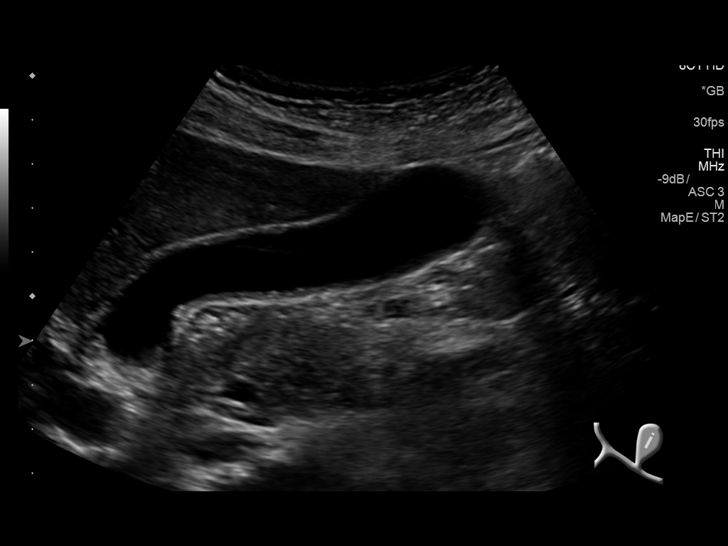
[im 14/55]
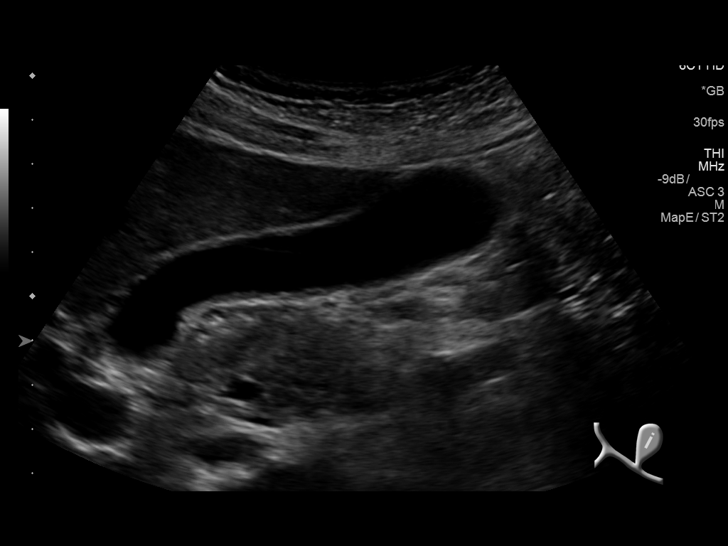
[im 19/55]
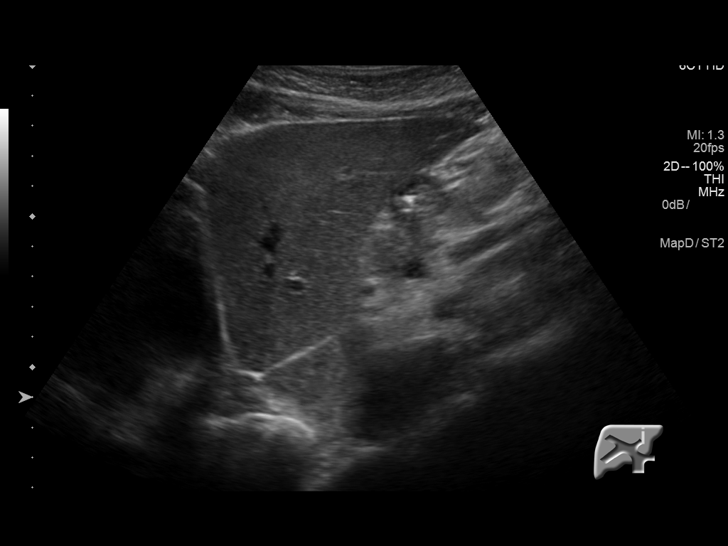
[im 21/55]
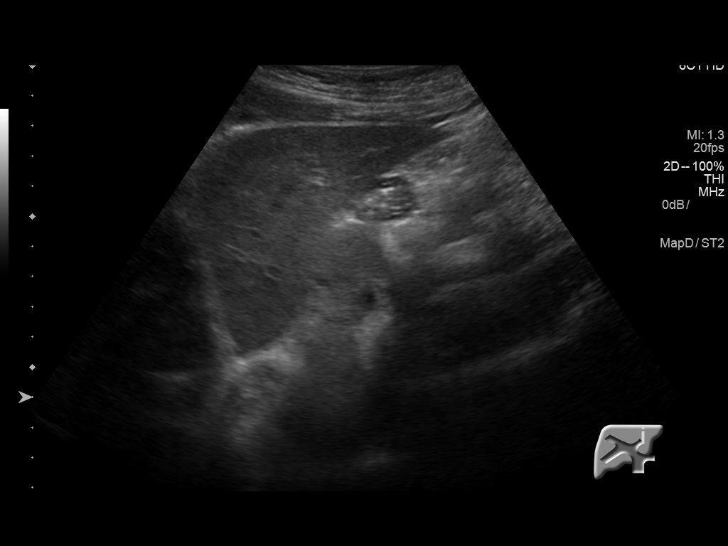
[im 25/55]
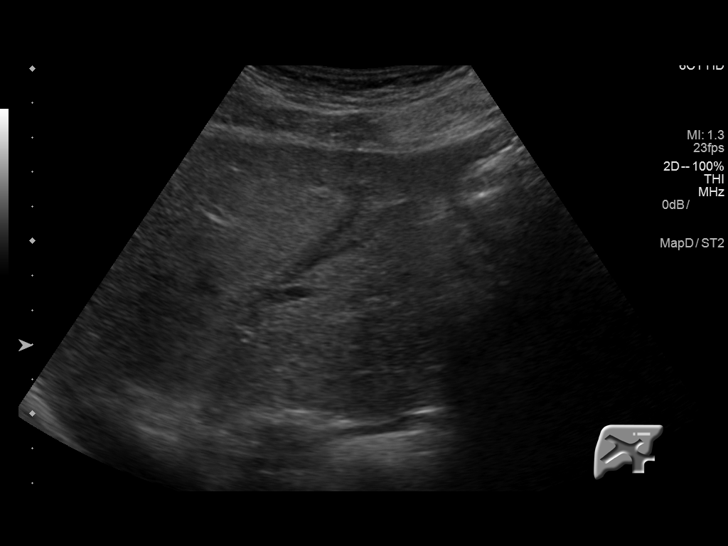
[im 30/55]
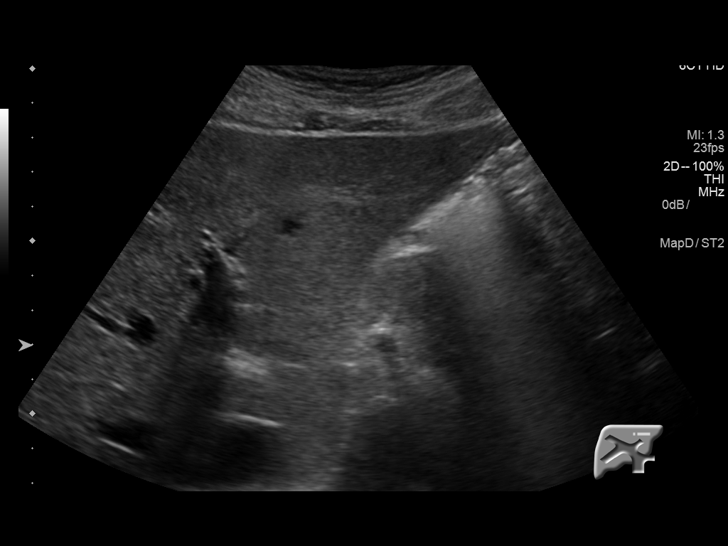
[im 34/55]
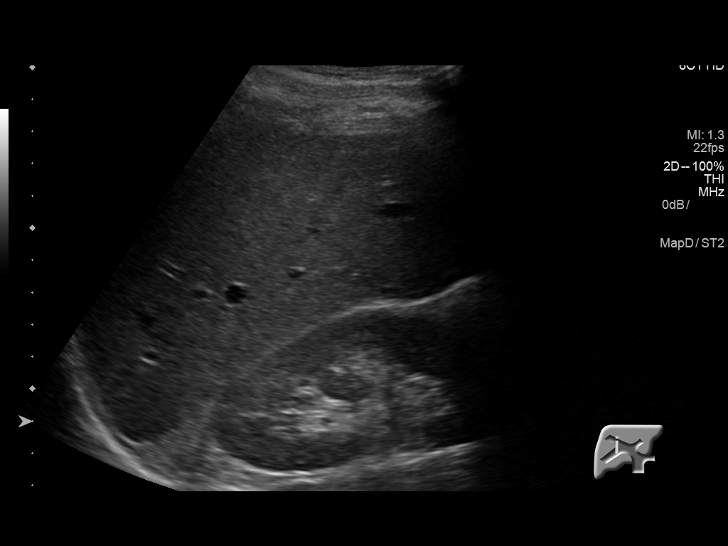
[im 37/55]
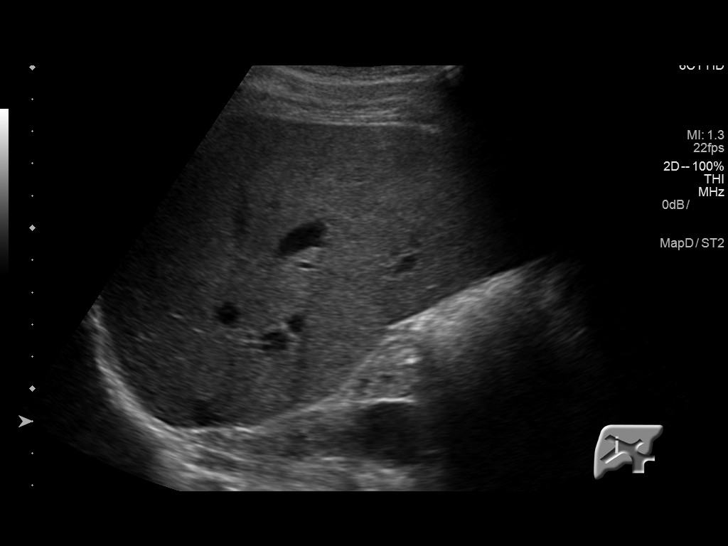
[im 41/55]
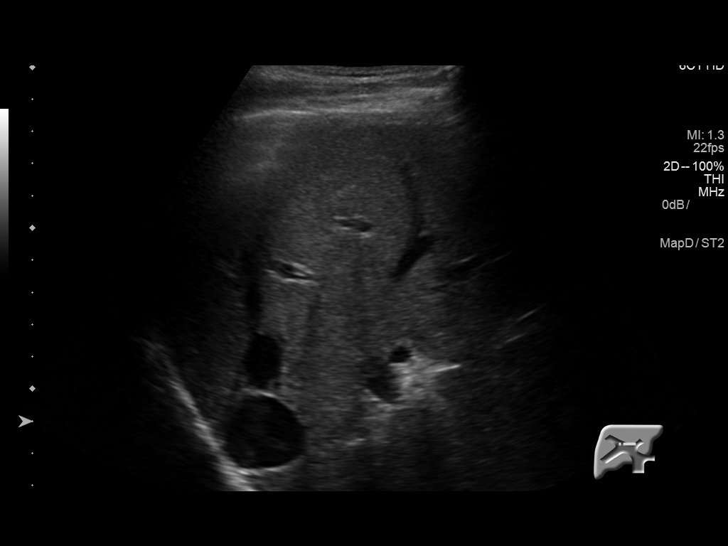
[im 46/55]
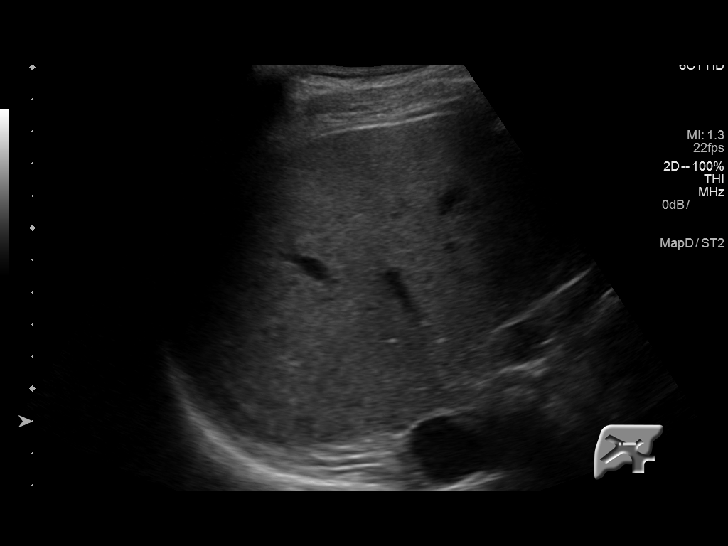
[im 50/55]
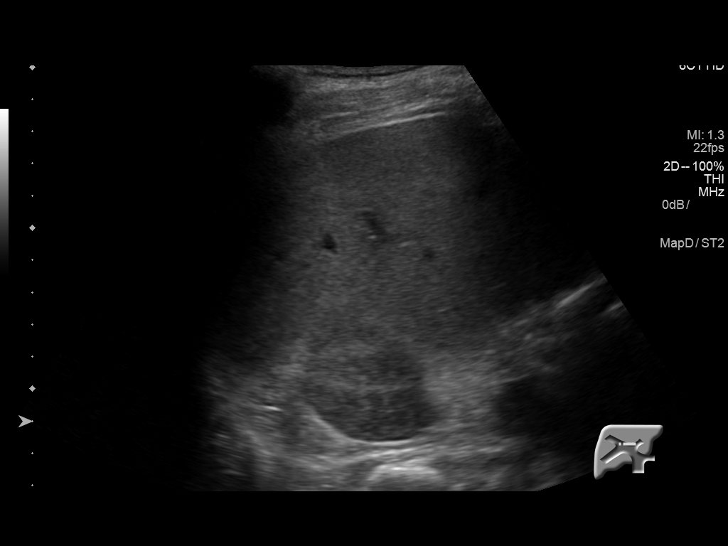
[im 55/55]
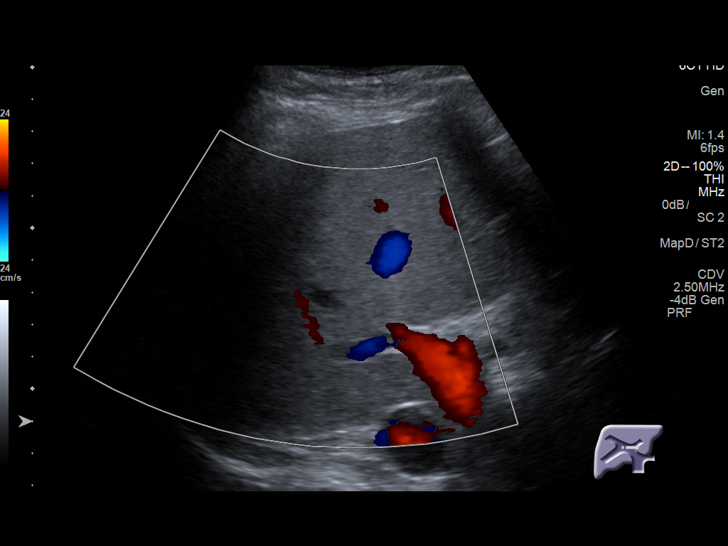

[14 of 25 positions shown; findings below may reference images not displayed]

FINDINGS: Gallbladder:

No gallstones or wall thickening visualized. There is no
pericholecystic fluid. No sonographic Murphy sign noted by
sonographer.

Common bile duct:

Diameter: 5 mm. No intrahepatic or extrahepatic biliary duct
dilatation.

Liver:

No focal lesion identified. Within normal limits in parenchymal
echogenicity.
IMPRESSION: Study within normal limits.

## 2019-07-08 ENCOUNTER — Emergency Department (HOSPITAL_COMMUNITY)
Admission: EM | Admit: 2019-07-08 | Discharge: 2019-07-08 | Disposition: A | Discharge status: Home / Self Care | Payer: Medicaid Other | Attending: Emergency Medicine | Admitting: Emergency Medicine

## 2019-07-08 ENCOUNTER — Encounter (HOSPITAL_COMMUNITY): Payer: Self-pay | Admitting: *Deleted

## 2019-07-08 ENCOUNTER — Other Ambulatory Visit: Payer: Self-pay

## 2019-07-08 DIAGNOSIS — M5410 Radiculopathy, site unspecified: Secondary | ICD-10-CM | POA: Insufficient documentation

## 2019-07-08 DIAGNOSIS — R202 Paresthesia of skin: Secondary | ICD-10-CM | POA: Insufficient documentation

## 2019-07-08 DIAGNOSIS — Y999 Unspecified external cause status: Secondary | ICD-10-CM | POA: Diagnosis not present

## 2019-07-08 DIAGNOSIS — Y929 Unspecified place or not applicable: Secondary | ICD-10-CM | POA: Diagnosis not present

## 2019-07-08 DIAGNOSIS — S161XXA Strain of muscle, fascia and tendon at neck level, initial encounter: Secondary | ICD-10-CM | POA: Insufficient documentation

## 2019-07-08 DIAGNOSIS — F17210 Nicotine dependence, cigarettes, uncomplicated: Secondary | ICD-10-CM | POA: Diagnosis not present

## 2019-07-08 DIAGNOSIS — Y939 Activity, unspecified: Secondary | ICD-10-CM | POA: Insufficient documentation

## 2019-07-08 DIAGNOSIS — Z79899 Other long term (current) drug therapy: Secondary | ICD-10-CM | POA: Insufficient documentation

## 2019-07-08 DIAGNOSIS — R200 Anesthesia of skin: Secondary | ICD-10-CM | POA: Diagnosis present

## 2019-07-08 MED ORDER — HYDROMORPHONE HCL 1 MG/ML IJ SOLN
1.0000 mg | Freq: Once | INTRAMUSCULAR | Status: AC
  Administered 2019-07-08: 1 mg via INTRAMUSCULAR
  Filled 2019-07-08: qty 1

## 2019-07-08 MED ORDER — METHYLPREDNISOLONE SODIUM SUCC 125 MG IJ SOLR
125.0000 mg | Freq: Once | INTRAMUSCULAR | Status: AC
  Administered 2019-07-08: 125 mg via INTRAMUSCULAR
  Filled 2019-07-08: qty 2

## 2019-07-08 MED ORDER — PREDNISONE 10 MG PO TABS: 20.0000 mg | ORAL_TABLET | Freq: Every day | ORAL | 0 refills | Status: DC

## 2019-07-08 NOTE — Discharge Instructions (Addendum)
Follow up with your doctor this week.
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
## 2019-07-16 ENCOUNTER — Ambulatory Visit: Payer: Medicaid Other | Admitting: Orthopedic Surgery

## 2019-08-01 ENCOUNTER — Other Ambulatory Visit: Payer: Self-pay

## 2019-08-01 ENCOUNTER — Ambulatory Visit: Payer: Medicaid Other | Admitting: Orthopedic Surgery

## 2019-08-01 VITALS — BP 84/60 | HR 67 | Temp 97.9°F | Ht 71.0 in | Wt 144.0 lb

## 2019-08-01 DIAGNOSIS — M7021 Olecranon bursitis, right elbow: Secondary | ICD-10-CM | POA: Diagnosis not present

## 2019-08-01 DIAGNOSIS — M7542 Impingement syndrome of left shoulder: Secondary | ICD-10-CM

## 2019-08-01 NOTE — Patient Instructions (Addendum)
You have received an injection of steroids into the joint. 15% of patients will have increased pain within the 24 hours postinjection.   This is transient and will go away.   We recommend that you use ice packs on the injection site for 20 minutes every 2 hours and extra strength Tylenol 2 tablets every 8 as needed until the pain resolves.  If you continue to have pain after taking the Tylenol and using the ice please call the office for further instructions.    Shoulder Range of Motion Exercises Shoulder range of motion (ROM) exercises are done to keep the shoulder moving freely or to increase movement. They are often recommended for people who have shoulder pain or stiffness or who are recovering from a shoulder surgery. Phase 1 exercises When you are able, do this exercise 1-2 times per day for 30-60 seconds in each direction, or as directed by your health care provider. Pendulum exercise To do this exercise while sitting: 1. Sit in a chair or at the edge of your bed with your feet flat on the floor. 2. Let your affected arm hang down in front of you over the edge of the bed or chair. 3. Relax your shoulder, arm, and hand. [place] your body so your arm gently swings in small circles. You can also use your unaffected arm to start the motion. 5. Repeat changing the direction of the circles, swinging your arm left and right, and swinging your arm forward and back. To do this exercise while standing: 1. Stand next to a sturdy chair or table, and hold on to it with your hand on your unaffected side. 2. Bend forward at the waist. 3. Bend your knees slightly. 4. Relax your shoulder, arm, and hand. 5. While keeping your shoulder relaxed, use body motion to swing your arm in small circles. 6. Repeat changing the direction of the circles, swinging your arm left and right, and swinging your arm forward and back. 7. Between exercises, stand up tall and take a short break to relax your lower  back.  Phase 2 exercises Do these exercises 1-2 times per day or as told by your health care provider. Hold each stretch for 30 seconds, and repeat 3 times. Do the exercises with one or both arms as instructed by your health care provider. For these exercises, sit at a table with your hand and arm supported by the table. A chair that slides easily or has wheels can be helpful. External rotation 1. Turn your chair so that your affected side is nearest to the table. 2. Place your forearm on the table to your side. Bend your elbow about 90 at the elbow (right angle) and place your hand palm facing down on the table. Your elbow should be about 6 inches away from your side. 3. Keeping your arm on the table, lean your body forward. Abduction 1. Turn your chair so that your affected side is nearest to the table. 2. Place your forearm and hand on the table so that your thumb points toward the ceiling and your arm is straight out to your side. 3. Slide your hand out to the side and away from you, using your unaffected arm to do the work. 4. To increase the stretch, you can slide your chair away from the table. Flexion: forward stretch 1. Sit facing the table. Place your hand and elbow on the table in front of you. 2. Slide your hand forward and away from you, using  your unaffected arm to do the work. 3. To increase the stretch, you can slide your chair backward. Phase 3 exercises Do these exercises 1-2 times per day or as told by your health care provider. Hold each stretch for 30 seconds, and repeat 3 times. Do the exercises with one or both arms as instructed by your health care provider. Cross-body stretch: posterior capsule stretch 1. Lift your arm straight out in front of you. 2. Bend your arm 90 at the elbow (right angle) so your forearm moves across your body. 3. Use your other arm to gently pull the elbow across your body, toward your other shoulder. Wall climbs 1. Stand with your affected  arm extended out to the side with your hand resting on a door frame. 2. Slide your hand slowly up the door frame. 3. To increase the stretch, step through the door frame. Keep your body upright and do not lean. Wand exercises You will need a cane, a piece of PVC pipe, or a sturdy wooden dowel for wand exercises. Flexion To do this exercise while standing: 1. Hold the wand with both of your hands, palms down. 2. Using the other arm to help, lift your arms up and over your head, if able. 3. Push upward with your other arm to gently increase the stretch. To do this exercise while lying down: 1. Lie on your back with your elbows resting on the floor and the wand in both your hands. Your hands will be palm down, or pointing toward your feet. 2. Lift your hands toward the ceiling, using your unaffected arm to help if needed. 3. Bring your arms overhead as able, using your unaffected arm to help if needed. Internal rotation 1. Stand while holding the wand behind you with both hands. Your unaffected arm should be extended above your head with the arm of the affected side extended behind you at the level of your waist. The wand should be pointing straight up and down as you hold it. 2. Slowly pull the wand up behind your back by straightening the elbow of your unaffected arm and bending the elbow of your affected arm. External rotation 1. Lie on your back with your affected upper arm supported on a small pillow or rolled towel. When you first do this exercise, keep your upper arm close to your body. Over time, bring your arm up to a 90 angle out to the side. 2. Hold the wand across your stomach and with both hands palm up. Your elbow on your affected side should be bent at a 90 angle. 3. Use your unaffected side to help push your forearm away from you and toward the floor. Keep your elbow on your affected side bent at a 90 angle. Contact a health care provider if you have:  New or increasing  pain.  New numbness, tingling, weakness, or discoloration in your arm or hand. This information is not intended to replace advice given to you by your health care provider. Make sure you discuss any questions you have with your health care provider. Document Released: 05/08/2003 Document Revised: 09/21/2017 Document Reviewed: 09/21/2017 Elsevier Patient Education  [place].    Take the antibiotics for the elbow
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
## 2019-12-19 ENCOUNTER — Other Ambulatory Visit: Payer: Self-pay

## 2019-12-19 ENCOUNTER — Ambulatory Visit: Payer: Medicaid Other | Admitting: Orthopedic Surgery

## 2019-12-19 VITALS — BP 130/80 | HR 65 | Temp 98.2°F | Ht 71.0 in | Wt 143.0 lb

## 2019-12-19 DIAGNOSIS — M7711 Lateral epicondylitis, right elbow: Secondary | ICD-10-CM | POA: Diagnosis not present

## 2019-12-19 DIAGNOSIS — M7542 Impingement syndrome of left shoulder: Secondary | ICD-10-CM | POA: Diagnosis not present

## 2019-12-19 NOTE — Progress Notes (Signed)
Chief Complaint  Patient presents with  . Follow-up    Recheck on left shoulder    Encounter Diagnoses  Name Primary?  . Impingement syndrome of left shoulder Yes  . Right tennis elbow    Mr. Tokarczyk says that his injections last about 3 months and he would like them repeated   Procedure note the subacromial injection shoulder left   Verbal consent was obtained to inject the  Left   Shoulder  Timeout was completed to confirm the injection site is a subacromial space of the  left  shoulder  Medication used Depo-Medrol 40 mg and lidocaine 1% 3 cc  Anesthesia was provided by ethyl chloride  The injection was performed in the left  posterior subacromial space. After pinning the skin with alcohol and anesthetized the skin with ethyl chloride the subacromial space was injected using a 20-gauge needle. There were no complications  Sterile dressing was applied.  Procedure note injection for right tennis elbow   Diagnosis right tennis elbow  Anesthesia ethyl chloride was used Alcohol use is clean the skin  After we obtained verbal consent and timeout a 25-gauge needle was used to inject 40 mg of Depo-Medrol and 3 cc of 1% lidocaine just distal to the insertion of the ECRB  There were no complications and a sterile bandage was applied.   Return 3 months

## 2019-12-19 NOTE — Patient Instructions (Signed)
Joint Steroid Injection A joint steroid injection is a procedure to relieve swelling and pain in a joint. Steroids are medicines that reduce inflammation. In this procedure, your health care provider uses a syringe and a needle to inject a steroid medicine into a painful and inflamed joint. A pain-relieving medicine (anesthetic) may be injected along with the steroid. In some cases, your health care provider may use an imaging technique such as ultrasound or fluoroscopy to guide the injection. Joints that are often treated with steroid injections include the knee, shoulder, hip, and spine. These injections may also be used in the elbow, ankle, and joints of the hands or feet. You may have joint steroid injections as part of your treatment for inflammation caused by:  Gout.  Rheumatoid arthritis.  Advanced wear-and-tear arthritis (osteoarthritis).  Tendinitis.  Bursitis. Joint steroid injections may be repeated, but having them too often can damage a joint or the skin over the joint. You should not have joint steroid injections less than 6 weeks apart or more than four times a year. Tell a health care provider about:  Any allergies you have.  All medicines you are taking, including vitamins, herbs, eye drops, creams, and over-the-counter medicines.  Any problems you or family members have had with anesthetic medicines.  Any blood disorders you have.  Any surgeries you have had.  Any medical conditions you have.  Whether you are pregnant or may be pregnant. What are the risks? Generally, this is a safe treatment. However, problems may occur, including:  Infection.  Bleeding.  Allergic reactions to medicines.  Damage to the joint or tissues around the joint.  Thinning of skin or loss of skin color over the joint.  Temporary flushing of the face or chest.  Temporary increase in pain.  Temporary increase in blood sugar.  Failure to relieve inflammation or pain. What  happens before the treatment?  You may have imaging tests of your joint.  Ask your health care provider about: ? Changing or stopping your regular medicines. This is especially important if you are taking diabetes medicines or blood thinners. ? Taking medicines such as aspirin and ibuprofen. These medicines can thin your blood. Do not take these medicines unless your health care provider tells you to take them. ? Taking over-the-counter medicines, vitamins, herbs, and supplements.  Ask your health care provider if you can drive yourself home after the procedure. What happens during the treatment?   Your health care provider will position you for the injection and locate the injection site over your joint.  The skin over the joint will be cleaned with a germ-killing soap.  Your health care provider may: ? Spray a numbing solution (topical anesthetic) over the injection site. ? Inject a local anesthetic under the skin above your joint.  The needle will be placed through your skin into your joint. Your health care provider may use imaging to guide the needle to the right spot for the injection. If imaging is used, a special contrast dye may be injected to confirm that the needle is in the correct location.  The steroid medicine will be injected into your joint.  Anesthetic may be injected along with the steroid. This may be a medicine that relieves pain for a short time (short-acting anesthetic) or for a longer time (long-acting anesthetic).  The needle will be removed, and an adhesive bandage (dressing) will be placed over the injection site. The procedure may vary among health care providers and hospitals. What can I   expect after the treatment?  You will be able to go home after the treatment.  It is normal to feel slight flushing for a few days after the injection.  After the treatment, it is common to have an increase in joint pain after the anesthetic has worn off. This may  happen about an hour after a short-acting anesthetic or about 8 hours after a longer-acting anesthetic.  You should begin to feel relief from joint pain and swelling after 24 to 48 hours. Follow these instructions at home: Injection site care  Leave the adhesive dressing over your injection site in place until your health care provider says you can remove it.  Check your injection site every day for signs of infection. Check for: ? Redness, swelling, or pain. ? Fluid or blood. ? Warmth. ? Pus or a bad smell. Activity  Return to your normal activities as told by your health care provider. Ask your health care provider what activities are safe for you. You may be asked to limit activities that put stress on the joint for a few days.  Do joint exercises as told by your health care provider.  Do not take baths, swim, or use a hot tub until your health care provider approves. Managing pain, stiffness, and swelling   If directed, put ice on the joint. ? Put ice in a plastic bag. ? Place a towel between your skin and the bag. ? Leave the ice on for 20 minutes, 2-3 times a day.  Raise (elevate) your joint above the level of your heart when you are sitting or lying down. General instructions  Take over-the-counter and prescription medicines only as told by your health care provider.  Do not use any products that contain nicotine or tobacco, such as cigarettes, e-cigarettes, and chewing tobacco. These can delay joint healing. If you need help quitting, ask your health care provider.  If you have diabetes, be aware that your blood sugar may be slightly elevated for several days after the injection.  Keep all follow-up visits as told by your health care provider. This is important. Contact a health care provider if you have:  Chills or a fever.  Any signs of infection at your injection site.  Increased pain or swelling or no relief after 2 days. Summary  A joint steroid injection  is a treatment to relieve pain and swelling in a joint.  Steroids are medicines that reduce inflammation. Your health care provider may add an anesthetic along with the steroid.  You may have joint steroid injections as part of your arthritis treatment.  Joint steroid injections may be repeated, but having them too often can damage a joint or the skin over the joint.  Contact your health care provider if you have a fever, chills, or signs of infection or if you get no relief from joint pain or swelling. This information is not intended to replace advice given to you by your health care provider. Make sure you discuss any questions you have with your health care provider. Document Revised: 04/11/2018 Document Reviewed: 04/11/2018 Elsevier Patient Education  2020 [employer]  

## 2020-02-01 ENCOUNTER — Ambulatory Visit (HOSPITAL_COMMUNITY)
Admission: RE | Admit: 2020-02-01 | Discharge: 2020-02-01 | Disposition: A | Discharge status: Home / Self Care | Payer: Medicaid Other | Source: Ambulatory Visit | Attending: Neurology | Admitting: Neurology

## 2020-02-01 ENCOUNTER — Other Ambulatory Visit: Payer: Self-pay

## 2020-02-01 ENCOUNTER — Other Ambulatory Visit (HOSPITAL_COMMUNITY): Payer: Self-pay | Admitting: Neurology

## 2020-02-01 DIAGNOSIS — M25532 Pain in left wrist: Secondary | ICD-10-CM | POA: Diagnosis present

## 2020-02-01 DIAGNOSIS — M79642 Pain in left hand: Secondary | ICD-10-CM | POA: Diagnosis not present

## 2020-03-19 ENCOUNTER — Ambulatory Visit: Payer: Medicaid Other | Admitting: Orthopedic Surgery

## 2020-03-19 ENCOUNTER — Telehealth: Payer: Self-pay | Admitting: Orthopedic Surgery

## 2020-03-19 ENCOUNTER — Encounter: Payer: Self-pay | Admitting: Orthopedic Surgery

## 2020-03-19 NOTE — Telephone Encounter (Signed)
Letter sent regarding today's appointment.

## 2020-04-14 ENCOUNTER — Ambulatory Visit: Payer: Medicaid Other | Admitting: Orthopedic Surgery

## 2020-04-14 ENCOUNTER — Encounter: Payer: Self-pay | Admitting: Orthopedic Surgery

## 2020-05-19 ENCOUNTER — Ambulatory Visit: Payer: Medicaid Other | Admitting: Orthopedic Surgery

## 2020-05-19 ENCOUNTER — Encounter: Payer: Self-pay | Admitting: Orthopedic Surgery

## 2020-07-03 ENCOUNTER — Ambulatory Visit: Payer: Medicaid Other | Admitting: Orthopedic Surgery

## 2020-10-03 ENCOUNTER — Other Ambulatory Visit (HOSPITAL_COMMUNITY): Payer: Self-pay | Admitting: Family Medicine

## 2020-10-03 DIAGNOSIS — M25512 Pain in left shoulder: Secondary | ICD-10-CM

## 2020-10-09 ENCOUNTER — Ambulatory Visit (INDEPENDENT_AMBULATORY_CARE_PROVIDER_SITE_OTHER): Payer: Medicaid Other | Admitting: Orthopedic Surgery

## 2020-10-09 ENCOUNTER — Encounter: Payer: Self-pay | Admitting: Orthopedic Surgery

## 2020-10-09 ENCOUNTER — Other Ambulatory Visit: Payer: Self-pay

## 2020-10-09 ENCOUNTER — Ambulatory Visit (HOSPITAL_COMMUNITY)
Admission: RE | Admit: 2020-10-09 | Discharge: 2020-10-09 | Disposition: A | Discharge status: Home / Self Care | Payer: Medicaid Other | Source: Ambulatory Visit | Attending: Family Medicine | Admitting: Family Medicine

## 2020-10-09 VITALS — BP 156/94 | HR 98 | Wt 139.0 lb

## 2020-10-09 DIAGNOSIS — M7541 Impingement syndrome of right shoulder: Secondary | ICD-10-CM | POA: Diagnosis not present

## 2020-10-09 DIAGNOSIS — M25512 Pain in left shoulder: Secondary | ICD-10-CM | POA: Diagnosis present

## 2020-10-09 DIAGNOSIS — M7542 Impingement syndrome of left shoulder: Secondary | ICD-10-CM | POA: Diagnosis not present

## 2020-10-09 NOTE — Progress Notes (Addendum)
Chief Complaint  Patient presents with  . Injections    Pt would like to get injections in shoulders.   Bilateral shoulder injections requested:   60 year old male with impingement syndrome left shoulder.  He has had cortisone injections in the subacromial space.  He says those injections last about 3 months and he would like it repeated  Procedure injection right shoulder subacromial joint and left shoulder subacromial joint   procedure note the subacromial injection shoulder RIGHT  Verbal consent was obtained to inject the  RIGHT   Shoulder  Timeout was completed to confirm the injection site is a subacromial space of the  RIGHT  shoulder   Medication used celestone 6 mg and lidocaine 1% 3 cc  Anesthesia was provided by ethyl chloride  The injection was performed in the RIGHT  posterior subacromial space. After pinning the skin with alcohol and anesthetized the skin with ethyl chloride the subacromial space was injected using a 20-gauge needle. There were no complications  Sterile dressing was applied.    Procedure note the subacromial injection shoulder left   Verbal consent was obtained to inject the  Left   Shoulder  Timeout was completed to confirm the injection site is a subacromial space of the  left  shoulder  Medication used 6 mg celestone  mg and lidocaine 1% 3 cc  Anesthesia was provided by ethyl chloride  The injection was performed in the left  posterior subacromial space. After pinning the skin with alcohol and anesthetized the skin with ethyl chloride the subacromial space was injected using a 20-gauge needle. There were no complications  Sterile dressing was applied.   Encounter Diagnoses  Name Primary?  . Impingement syndrome of left shoulder Yes  . Impingement syndrome of right shoulder

## 2020-10-09 NOTE — Patient Instructions (Signed)

## 2021-02-27 ENCOUNTER — Telehealth: Payer: Self-pay

## 2021-02-27 NOTE — Telephone Encounter (Signed)
No new patient per Gray 

## 2021-04-30 ENCOUNTER — Encounter: Payer: Self-pay | Admitting: Orthopedic Surgery

## 2021-04-30 ENCOUNTER — Ambulatory Visit (INDEPENDENT_AMBULATORY_CARE_PROVIDER_SITE_OTHER): Payer: Medicaid Other | Admitting: Orthopedic Surgery

## 2021-04-30 ENCOUNTER — Other Ambulatory Visit: Payer: Self-pay

## 2021-04-30 VITALS — BP 140/89 | HR 82 | Ht 69.0 in | Wt 143.0 lb

## 2021-04-30 DIAGNOSIS — M7542 Impingement syndrome of left shoulder: Secondary | ICD-10-CM

## 2021-04-30 DIAGNOSIS — M7541 Impingement syndrome of right shoulder: Secondary | ICD-10-CM

## 2021-04-30 NOTE — Patient Instructions (Signed)

## 2021-04-30 NOTE — Progress Notes (Signed)
Chief Complaint  Patient presents with   Follow-up    Recheck on bilateral shoulder pain.    Curtis Baldwin is 60 years old he has chronic pain he is seen at [facility] for chronic pain management but comes in today complaining of multiple joints and tendons hurting  He has bilateral chronic shoulder pain he has bilateral elbow pain probably from tennis elbow  Has been getting injections in his shoulders which have helped  He also complains of other joints hurting  His symptoms are way out of spectrum for me to try to address as these are nonsurgical issues  I am comfortable giving him bilateral subacromial injections and refer him to rheumatology for further work-up of his multiple joint aches and pains and he should see a hand surgeon for his carpal tunnel symptoms triggering which seem to be multiple and best evaluated with a hand specialist  I have given him a note to take to his primary care doctor to get him appropriate referrals and I went ahead and gave him 2 subacromial injections    Encounter Diagnoses  Name Primary?   Impingement syndrome of left shoulder Yes   Impingement syndrome of right shoulder     Injection #1 right shoulder subacromial space Site was confirmed and consent was given for the injection.  The skin was cleaned with ethyl chloride sprayed with alcohol and then 6 mg of Celestone with lidocaine injected left shoulder  Injection #2 left shoulder subacromial space the procedure was repeated in the left shoulder subacromial space with the same exact medication

## 2021-09-25 ENCOUNTER — Other Ambulatory Visit: Payer: Self-pay | Admitting: Surgery

## 2021-09-25 DIAGNOSIS — B182 Chronic viral hepatitis C: Secondary | ICD-10-CM

## 2021-10-08 NOTE — Progress Notes (Addendum)
Anesthesia Review:  PCP: DR Nancy Fetter with [facility]  on [address]  Cardiologist : none  Chest x-ray : EKG :10/12/21.  Echo : Stress test: Cardiac Cath :  Activity level: can doa  flight of stairs without difficulty  Sleep Study/ CPAP : none  Fasting Blood Sugar :      / Checks Blood Sugar -- times a day:   Blood Thinner/ Instructions /Last Dose: ASA / Instructions/ Last Dose :   No covid test- ambulatory surgery  Hgba1c-10/12/21- 5.9  Prediabetes- does nto check glucose at home  Smoker  CBC done 10/12/21 routed to DR [facility]

## 2021-10-08 NOTE — Progress Notes (Signed)
Your procedure is scheduled on:                10/28/21   Report to [facility] Main  Entrance   Report to admitting at    1115 AM     Call this number if you have problems the morning of surgery [phone]    REMEMBER: NO  SOLID FOOD CANDY OR GUM AFTER MIDNIGHT. CLEAR LIQUIDS UNTIL    1030  AM     . NOTHING BY MOUTH EXCEPT CLEAR LIQUIDS UNTIL  1030 AM   . PLEASE FINISH ENSURE DRINK PER SURGEON ORDER  WHICH NEEDS TO BE COMPLETED AT      1030AM .      CLEAR LIQUID DIET   Foods Allowed                                                                    Coffee and tea, regular and decaf                            Fruit ices (not with fruit pulp)                                      Iced Popsicles                                    Carbonated beverages, regular and diet                                    Cranberry, grape and apple juices Sports drinks like Gatorade Lightly seasoned clear broth or consume(fat free) Sugar, honey syrup ___________________________________________________________________      BRUSH YOUR TEETH MORNING OF SURGERY AND RINSE YOUR MOUTH OUT, NO CHEWING GUM CANDY OR MINTS.     Take these medicines the morning of surgery with A SIP OF WATER:  GABAPENIT, INHALERS AS USUAL AND FLOMAX   DO NOT TAKE ANY DIABETIC MEDICATIONS DAY OF YOUR SURGERY                               You may not have any metal on your body including hair pins and              piercings  Do not wear jewelry, make-up, lotions, powders or perfumes, deodorant             Do not wear nail polish on your fingernails.  Do not shave  48 hours prior to surgery.              Men may shave face and neck.   Do not bring valuables to the hospital. [place].  Contacts, dentures or bridgework may not be worn into surgery.  Leave suitcase in the car. After surgery it may  be brought to your room.     Patients discharged  the day of surgery will not be allowed to drive home. IF YOU ARE HAVING SURGERY AND GOING HOME THE SAME DAY, YOU MUST HAVE AN ADULT TO DRIVE YOU HOME AND BE WITH YOU FOR 24 HOURS. YOU MAY GO HOME BY TAXI OR UBER OR ORTHERWISE, BUT AN ADULT MUST ACCOMPANY YOU HOME AND STAY WITH YOU FOR 24 HOURS.  Name and phone number of your driver:  Special Instructions: N/A              Please read over the following fact sheets you were given: _____________________________________________________________________  [facility] - Preparing for Surgery Before surgery, you can play an important role.  Because skin is not sterile, your skin needs to be as free of germs as possible.  You can reduce the number of germs on your skin by washing with CHG (chlorahexidine gluconate) soap before surgery.  CHG is an antiseptic cleaner which kills germs and bonds with the skin to continue killing germs even after washing. Please DO NOT use if you have an allergy to CHG or antibacterial soaps.  If your skin becomes reddened/irritated stop using the CHG and inform your nurse when you arrive at Short Stay. Do not shave (including legs and underarms) for at least 48 hours prior to the first CHG shower.  You may shave your face/neck. Please follow these instructions carefully:  1.  Shower with CHG Soap the night before surgery and the  morning of Surgery.  2.  If you choose to wash your hair, wash your hair first as usual with your  normal  shampoo.  3.  After you shampoo, rinse your hair and body thoroughly to remove the  shampoo.                           4.  Use CHG as you would any other liquid soap.  You can apply chg directly  to the skin and wash                       Gently with a scrungie or clean washcloth.  5.  Apply the CHG Soap to your body ONLY FROM THE NECK DOWN.   Do not use on face/ open                           Wound or open sores. Avoid contact with eyes, ears mouth and genitals (private parts).                        Wash face,  Genitals (private parts) with your normal soap.             6.  Wash thoroughly, paying special attention to the area where your surgery  will be performed.  7.  Thoroughly rinse your body with warm water from the neck down.  8.  DO NOT shower/wash with your normal soap after using and rinsing off  the CHG Soap.                9.  Pat yourself dry with a clean towel.            10.  Wear clean pajamas.            11.  Place clean sheets on your bed the night of  your first shower and do not  sleep with pets. Day of Surgery : Do not apply any lotions/deodorants the morning of surgery.  Please wear clean clothes to the hospital/surgery center.  FAILURE TO FOLLOW THESE INSTRUCTIONS MAY RESULT IN THE CANCELLATION OF YOUR SURGERY PATIENT SIGNATURE_________________________________  NURSE SIGNATURE__________________________________  ________________________________________________________________________

## 2021-10-12 ENCOUNTER — Other Ambulatory Visit: Payer: Self-pay

## 2021-10-12 ENCOUNTER — Encounter (HOSPITAL_COMMUNITY)
Admission: RE | Admit: 2021-10-12 | Discharge: 2021-10-12 | Disposition: A | Discharge status: Home / Self Care | Payer: Medicaid Other | Source: Ambulatory Visit | Attending: Surgery | Admitting: Surgery

## 2021-10-12 ENCOUNTER — Encounter (HOSPITAL_COMMUNITY): Payer: Self-pay

## 2021-10-12 VITALS — BP 122/71 | HR 75 | Temp 98.9°F | Resp 16 | Ht 69.0 in | Wt 149.0 lb

## 2021-10-12 DIAGNOSIS — B182 Chronic viral hepatitis C: Secondary | ICD-10-CM | POA: Diagnosis not present

## 2021-10-12 DIAGNOSIS — R7303 Prediabetes: Secondary | ICD-10-CM | POA: Insufficient documentation

## 2021-10-12 DIAGNOSIS — Z01818 Encounter for other preprocedural examination: Secondary | ICD-10-CM | POA: Diagnosis not present

## 2021-10-12 HISTORY — DX: Chronic obstructive pulmonary disease, unspecified: J44.9

## 2021-10-12 LAB — COMPREHENSIVE METABOLIC PANEL
ALT: 18 U/L (ref 0–44)
AST: 23 U/L (ref 15–41)
Albumin: 3.9 g/dL (ref 3.5–5.0)
Alkaline Phosphatase: 77 U/L (ref 38–126)
Anion gap: 5 (ref 5–15)
BUN: 19 mg/dL (ref 8–23)
CO2: 28 mmol/L (ref 22–32)
Calcium: 9 mg/dL (ref 8.9–10.3)
Chloride: 101 mmol/L (ref 98–111)
Creatinine, Ser: 0.94 mg/dL (ref 0.61–1.24)
GFR, Estimated: >60 mL/min (ref >60)
Glucose, Bld: 78 mg/dL (ref 70–99)
Potassium: 4.3 mmol/L (ref 3.5–5.1)
Sodium: 134 mmol/L — ABNORMAL LOW (ref 135–145)
Total Bilirubin: 0.4 mg/dL (ref 0.3–1.2)
Total Protein: 6.8 g/dL (ref 6.5–8.1)

## 2021-10-12 LAB — CBC WITH DIFFERENTIAL/PLATELET
Abs Immature Granulocytes: 0.04 10*3/uL (ref 0.00–0.07)
Basophils Absolute: 0.1 10*3/uL (ref 0.0–0.1)
Basophils Relative: 1 %
Eosinophils Absolute: 0.6 10*3/uL — ABNORMAL HIGH (ref 0.0–0.5)
Eosinophils Relative: 5 %
HCT: 45.3 % (ref 39.0–52.0)
Hemoglobin: 14.9 g/dL (ref 13.0–17.0)
Immature Granulocytes: 0 %
Lymphocytes Relative: 38 %
Lymphs Abs: 4.8 10*3/uL — ABNORMAL HIGH (ref 0.7–4.0)
MCH: 31.8 pg (ref 26.0–34.0)
MCHC: 32.9 g/dL (ref 30.0–36.0)
MCV: 96.6 fL (ref 80.0–100.0)
Monocytes Absolute: 1 10*3/uL (ref 0.1–1.0)
Monocytes Relative: 8 %
Neutro Abs: 6.1 10*3/uL (ref 1.7–7.7)
Neutrophils Relative %: 48 %
Platelets: 254 10*3/uL (ref 150–400)
RBC: 4.69 MIL/uL (ref 4.22–5.81)
RDW: 12 % (ref 11.5–15.5)
WBC: 12.7 10*3/uL — ABNORMAL HIGH (ref 4.0–10.5)
nRBC: 0 % (ref 0.0–0.2)

## 2021-10-12 LAB — GLUCOSE, CAPILLARY: Glucose-Capillary: 89 mg/dL (ref 70–99)

## 2021-10-12 LAB — HEMOGLOBIN A1C
Hgb A1c MFr Bld: 5.9 % — ABNORMAL HIGH (ref 4.8–5.6)
Mean Plasma Glucose: 123 mg/dL

## 2021-10-12 LAB — APTT: aPTT: 33 seconds (ref 24–36)

## 2021-10-12 LAB — PROTIME-INR
INR: 1 (ref 0.8–1.2)
Prothrombin Time: 13.4 seconds (ref 11.4–15.2)

## 2021-11-16 ENCOUNTER — Encounter (HOSPITAL_BASED_OUTPATIENT_CLINIC_OR_DEPARTMENT_OTHER): Payer: Self-pay | Admitting: Surgery

## 2021-11-16 ENCOUNTER — Other Ambulatory Visit: Payer: Self-pay

## 2021-11-20 ENCOUNTER — Encounter (HOSPITAL_BASED_OUTPATIENT_CLINIC_OR_DEPARTMENT_OTHER)
Admission: RE | Admit: 2021-11-20 | Discharge: 2021-11-20 | Disposition: A | Discharge status: Home / Self Care | Payer: Medicaid Other | Source: Ambulatory Visit | Attending: Surgery | Admitting: Surgery

## 2021-11-20 DIAGNOSIS — Z01812 Encounter for preprocedural laboratory examination: Secondary | ICD-10-CM | POA: Diagnosis not present

## 2021-11-20 LAB — COMPREHENSIVE METABOLIC PANEL
ALT: 16 U/L (ref 0–44)
AST: 18 U/L (ref 15–41)
Albumin: 3.6 g/dL (ref 3.5–5.0)
Alkaline Phosphatase: 75 U/L (ref 38–126)
Anion gap: 8 (ref 5–15)
BUN: 16 mg/dL (ref 8–23)
CO2: 26 mmol/L (ref 22–32)
Calcium: 9.1 mg/dL (ref 8.9–10.3)
Chloride: 105 mmol/L (ref 98–111)
Creatinine, Ser: 1.05 mg/dL (ref 0.61–1.24)
GFR, Estimated: >60 mL/min (ref >60)
Glucose, Bld: 79 mg/dL (ref 70–99)
Potassium: 4 mmol/L (ref 3.5–5.1)
Sodium: 139 mmol/L (ref 135–145)
Total Bilirubin: 0.4 mg/dL (ref 0.3–1.2)
Total Protein: 6.6 g/dL (ref 6.5–8.1)

## 2021-11-20 NOTE — Progress Notes (Signed)

## 2021-11-24 NOTE — H&P (Signed)
?REFERRING PHYSICIAN: Sandi Mariscal, MD ? ?PROVIDER: Beverlee Nims, MD ? ?MRN: [id number] ?DOB: 1960/09/02 ? ?Subjective  ? ?Chief Complaint: New Consultation (Inguinal hernia) ? ? ?History of Present Illness: ?Curtis Baldwin is a 61 y.o. male who is seen as an office consultation at the request of Dr. Nancy Fetter for evaluation of New Consultation (Inguinal hernia) ?.  ? ?This gentleman is referred here for evaluation of a right inguinal hernia. He reports that he noticed a hernia about 6 months ago. It is getting larger and causing discomfort in the right groin. The pain is mild to moderate. He reports that it will easily reduced. He has had no nausea, vomiting, or obstructive symptoms. ?He does have a history of hepatitis C. Previous labs from a year ago showed normal liver function test. An ultrasound from 2019 did not show evidence of cirrhosis. He denies jaundice. ?He does report that when he cuts himself that his blood is thin. He denies any abdominal distention or ascites. ?He smokes daily and has a history of COPD ? ?Review of Systems: ?A complete review of systems was obtained from the patient. I have reviewed this information and discussed as appropriate with the patient. See HPI as well for other ROS. ? ?ROS  ? ?Medical History: ?Past Medical History:  ?Diagnosis Date  ? Arthritis  ? COPD (chronic obstructive pulmonary disease) (CMS-HCC)  ? Liver disease  ? ?There is no problem list on file for this patient. ? ?History reviewed. No pertinent surgical history.  ? ?No Known Allergies ? ?Current Outpatient Medications on File Prior to Visit  ?Medication Sig Dispense Refill  ? meloxicam (MOBIC) 7.5 MG tablet Take 7.5 mg by mouth 2 (two) times daily  ? oxyCODONE (ROXICODONE) 15 MG immediate release tablet Take by mouth  ? tamsulosin (FLOMAX) 0.4 mg capsule Take 0.4 mg by mouth once daily  ? VENTOLIN HFA 90 mcg/actuation inhaler INHALE TWO PUFFS INTO THE LUNGS FOUR TIMES DAILY AS NEEDED  ? ?No current  facility-administered medications on file prior to visit.  ? ?History reviewed. No pertinent family history.  ? ?Social History  ? ?Tobacco Use  ?Smoking Status Every Day  ? Types: Cigarettes  ?Smokeless Tobacco Never  ? ? ?Social History  ? ?Socioeconomic History  ? Marital status: Legally Separated  ?Tobacco Use  ? Smoking status: Every Day  ?Types: Cigarettes  ? Smokeless tobacco: Never  ?Vaping Use  ? Vaping Use: Never used  ?Substance and Sexual Activity  ? Alcohol use: Not Currently  ? Drug use: Never  ? ?Objective:  ? ?Vitals:  ? ?BP: 118/60  ?Weight: 66.7 kg (147 lb)  ?Height: 175.3 cm ('5\' 9"'$ )  ? ?Body mass index is 21.71 kg/m?. ? ?Physical Exam  ? ?He appears well on exam. ? ?He is thin. ? ?His abdomen is soft and nontender. There is no hepatomegaly. There is no ascites. ? ?He has an easily reducible, nontender right inguinal hernia. There is no evidence of left inguinal hernia. ? ?Labs, Imaging and Diagnostic Testing: ?I have reviewed the labs from his primary care physician from a year ago and previous x-ray studies. ? ?Assessment and Plan:  ? ?Diagnoses and all orders for this visit: ? ?Right inguinal hernia ? ? ? ?This is a patient with a symptomatic right inguinal hernia. I discussed the diagnosis with the patient and his wife. We discussed abdominal wall anatomy as well as the need for hernia repair. We discussed the risk of incarceration or other problems without hernia  repair. ?Given his medical history, I would recommend an open right inguinal hernia pair with mesh. I discussed this procedure with him in detail. We discussed the risks which includes but is not limited to bleeding, infection, injury to surrounding structures, nerve entrapment, chronic pain, hernia recurrence, use of mesh, cardiopulmonary issues, etc. ?His issue currently is his history of hepatitis C and whether this is becoming symptomatic. I will need to check his CBC for platelet count, his liver function test, his PT, and his  PTT preoperatively. He understands and wished to proceed with surgery which will be scheduled.  ?

## 2021-11-25 ENCOUNTER — Ambulatory Visit (HOSPITAL_BASED_OUTPATIENT_CLINIC_OR_DEPARTMENT_OTHER): Payer: Medicaid Other | Admitting: Certified Registered"

## 2021-11-25 ENCOUNTER — Other Ambulatory Visit: Payer: Self-pay

## 2021-11-25 ENCOUNTER — Ambulatory Visit (HOSPITAL_BASED_OUTPATIENT_CLINIC_OR_DEPARTMENT_OTHER)
Admission: RE | Admit: 2021-11-25 | Discharge: 2021-11-25 | Disposition: A | Discharge status: Home / Self Care | Payer: Medicaid Other | Source: Ambulatory Visit | Attending: Surgery | Admitting: Surgery

## 2021-11-25 ENCOUNTER — Ambulatory Visit (HOSPITAL_BASED_OUTPATIENT_CLINIC_OR_DEPARTMENT_OTHER): Payer: Medicaid Other | Admitting: Physician Assistant

## 2021-11-25 ENCOUNTER — Encounter (HOSPITAL_BASED_OUTPATIENT_CLINIC_OR_DEPARTMENT_OTHER)
Admission: RE | Disposition: A | Discharge status: Home / Self Care | Payer: Self-pay | Source: Ambulatory Visit | Attending: Surgery

## 2021-11-25 ENCOUNTER — Encounter (HOSPITAL_BASED_OUTPATIENT_CLINIC_OR_DEPARTMENT_OTHER): Payer: Self-pay | Admitting: Surgery

## 2021-11-25 DIAGNOSIS — K4090 Unilateral inguinal hernia, without obstruction or gangrene, not specified as recurrent: Secondary | ICD-10-CM | POA: Insufficient documentation

## 2021-11-25 DIAGNOSIS — B1920 Unspecified viral hepatitis C without hepatic coma: Secondary | ICD-10-CM | POA: Diagnosis not present

## 2021-11-25 DIAGNOSIS — F17210 Nicotine dependence, cigarettes, uncomplicated: Secondary | ICD-10-CM | POA: Diagnosis not present

## 2021-11-25 DIAGNOSIS — Z8619 Personal history of other infectious and parasitic diseases: Secondary | ICD-10-CM | POA: Diagnosis not present

## 2021-11-25 DIAGNOSIS — G8929 Other chronic pain: Secondary | ICD-10-CM | POA: Diagnosis not present

## 2021-11-25 DIAGNOSIS — J449 Chronic obstructive pulmonary disease, unspecified: Secondary | ICD-10-CM | POA: Insufficient documentation

## 2021-11-25 DIAGNOSIS — Z79891 Long term (current) use of opiate analgesic: Secondary | ICD-10-CM | POA: Diagnosis not present

## 2021-11-25 HISTORY — PX: INGUINAL HERNIA REPAIR: SHX194

## 2021-11-25 SURGERY — REPAIR, HERNIA, INGUINAL, ADULT
Anesthesia: Regional | Site: Groin | Laterality: Right

## 2021-11-25 MED ORDER — FENTANYL CITRATE (PF) 100 MCG/2ML IJ SOLN
INTRAMUSCULAR | Status: AC
  Filled 2021-11-25: qty 2

## 2021-11-25 MED ORDER — OXYCODONE HCL 5 MG/5ML PO SOLN: 5.0000 mg | Freq: Once | ORAL | Status: DC | PRN

## 2021-11-25 MED ORDER — ACETAMINOPHEN 500 MG PO TABS
ORAL_TABLET | ORAL | Status: AC
  Filled 2021-11-25: qty 2

## 2021-11-25 MED ORDER — FENTANYL CITRATE (PF) 100 MCG/2ML IJ SOLN
INTRAMUSCULAR | Status: DC | PRN
  Administered 2021-11-25 (×2): 50 ug via INTRAVENOUS

## 2021-11-25 MED ORDER — PROPOFOL 10 MG/ML IV BOLUS
INTRAVENOUS | Status: DC | PRN
  Administered 2021-11-25: 200 mg via INTRAVENOUS

## 2021-11-25 MED ORDER — ONDANSETRON HCL 4 MG/2ML IJ SOLN
INTRAMUSCULAR | Status: DC | PRN
  Administered 2021-11-25: 4 mg via INTRAVENOUS

## 2021-11-25 MED ORDER — AMISULPRIDE (ANTIEMETIC) 5 MG/2ML IV SOLN: 10.0000 mg | Freq: Once | INTRAVENOUS | Status: DC | PRN

## 2021-11-25 MED ORDER — EPHEDRINE SULFATE (PRESSORS) 50 MG/ML IJ SOLN
INTRAMUSCULAR | Status: DC | PRN
  Administered 2021-11-25 (×2): 5 mg via INTRAVENOUS

## 2021-11-25 MED ORDER — FENTANYL CITRATE (PF) 100 MCG/2ML IJ SOLN
100.0000 ug | Freq: Once | INTRAMUSCULAR | Status: AC
  Administered 2021-11-25: 100 ug via INTRAVENOUS

## 2021-11-25 MED ORDER — KETOROLAC TROMETHAMINE 30 MG/ML IJ SOLN
INTRAMUSCULAR | Status: AC
  Filled 2021-11-25: qty 1

## 2021-11-25 MED ORDER — BUPIVACAINE-EPINEPHRINE 0.5% -1:200000 IJ SOLN
INTRAMUSCULAR | Status: DC | PRN
  Administered 2021-11-25: 10 mL

## 2021-11-25 MED ORDER — EPHEDRINE 5 MG/ML INJ
INTRAVENOUS | Status: AC
  Filled 2021-11-25: qty 5

## 2021-11-25 MED ORDER — CEFAZOLIN SODIUM-DEXTROSE 2-4 GM/100ML-% IV SOLN
INTRAVENOUS | Status: AC
  Filled 2021-11-25: qty 100

## 2021-11-25 MED ORDER — ROPIVACAINE HCL 5 MG/ML IJ SOLN
INTRAMUSCULAR | Status: DC | PRN
  Administered 2021-11-25: 30 mL via PERINEURAL

## 2021-11-25 MED ORDER — LIDOCAINE 2% (20 MG/ML) 5 ML SYRINGE
INTRAMUSCULAR | Status: AC
  Filled 2021-11-25: qty 5

## 2021-11-25 MED ORDER — LIDOCAINE HCL (CARDIAC) PF 100 MG/5ML IV SOSY
PREFILLED_SYRINGE | INTRAVENOUS | Status: DC | PRN
  Administered 2021-11-25: 40 mg via INTRAVENOUS

## 2021-11-25 MED ORDER — DEXAMETHASONE SODIUM PHOSPHATE 10 MG/ML IJ SOLN
INTRAMUSCULAR | Status: AC
  Filled 2021-11-25: qty 1

## 2021-11-25 MED ORDER — KETOROLAC TROMETHAMINE 30 MG/ML IJ SOLN
30.0000 mg | Freq: Once | INTRAMUSCULAR | Status: AC | PRN
  Administered 2021-11-25: 30 mg via INTRAVENOUS

## 2021-11-25 MED ORDER — ONDANSETRON HCL 4 MG/2ML IJ SOLN
INTRAMUSCULAR | Status: AC
  Filled 2021-11-25: qty 2

## 2021-11-25 MED ORDER — FENTANYL CITRATE (PF) 100 MCG/2ML IJ SOLN
25.0000 ug | INTRAMUSCULAR | Status: DC | PRN
  Administered 2021-11-25 (×2): 50 ug via INTRAVENOUS

## 2021-11-25 MED ORDER — CHLORHEXIDINE GLUCONATE CLOTH 2 % EX PADS: 6.0000 | MEDICATED_PAD | Freq: Once | CUTANEOUS | Status: DC

## 2021-11-25 MED ORDER — OXYCODONE HCL 5 MG PO TABS: 5.0000 mg | ORAL_TABLET | Freq: Once | ORAL | Status: DC | PRN

## 2021-11-25 MED ORDER — DEXAMETHASONE SODIUM PHOSPHATE 10 MG/ML IJ SOLN
INTRAMUSCULAR | Status: DC | PRN
  Administered 2021-11-25: 5 mg via INTRAVENOUS

## 2021-11-25 MED ORDER — LACTATED RINGERS IV SOLN: INTRAVENOUS | Status: DC

## 2021-11-25 MED ORDER — OXYCODONE HCL 5 MG PO TABS: 5.0000 mg | ORAL_TABLET | Freq: Four times a day (QID) | ORAL | 0 refills | Status: AC | PRN

## 2021-11-25 MED ORDER — MIDAZOLAM HCL 2 MG/2ML IJ SOLN
2.0000 mg | Freq: Once | INTRAMUSCULAR | Status: AC
Start: 2021-11-25 — End: 2021-11-25
  Administered 2021-11-25: 2 mg via INTRAVENOUS

## 2021-11-25 MED ORDER — MIDAZOLAM HCL 2 MG/2ML IJ SOLN
INTRAMUSCULAR | Status: AC
  Filled 2021-11-25: qty 2

## 2021-11-25 MED ORDER — CEFAZOLIN SODIUM-DEXTROSE 2-4 GM/100ML-% IV SOLN
2.0000 g | INTRAVENOUS | Status: AC
Start: 2021-11-25 — End: 2021-11-25
  Administered 2021-11-25: 2 g via INTRAVENOUS

## 2021-11-25 MED ORDER — ACETAMINOPHEN 500 MG PO TABS
1000.0000 mg | ORAL_TABLET | ORAL | Status: AC
  Administered 2021-11-25: 1000 mg via ORAL

## 2021-11-25 SURGICAL SUPPLY — 39 items
ADH SKN CLS APL DERMABOND .7 (GAUZE/BANDAGES/DRESSINGS) ×1
APL PRP STRL LF DISP 70% ISPRP (MISCELLANEOUS) ×1
BLADE CLIPPER SURG (BLADE) ×3 IMPLANT
BLADE SURG 15 STRL LF DISP TIS (BLADE) ×2 IMPLANT
BLADE SURG 15 STRL SS (BLADE) ×2
CHLORAPREP W/TINT 26 (MISCELLANEOUS) ×3 IMPLANT
COVER BACK TABLE 60X90IN (DRAPES) ×3 IMPLANT
COVER MAYO STAND STRL (DRAPES) ×3 IMPLANT
DERMABOND ADVANCED (GAUZE/BANDAGES/DRESSINGS) ×1
DERMABOND ADVANCED .7 DNX12 (GAUZE/BANDAGES/DRESSINGS) ×2 IMPLANT
DRAIN PENROSE .5X12 LATEX STL (DRAIN) ×3 IMPLANT
DRAPE LAPAROTOMY 100X72 PEDS (DRAPES) ×3 IMPLANT
DRAPE UTILITY XL STRL (DRAPES) ×3 IMPLANT
ELECT REM PT RETURN 9FT ADLT (ELECTROSURGICAL) ×2
ELECTRODE REM PT RTRN 9FT ADLT (ELECTROSURGICAL) ×2 IMPLANT
GLOVE SURG POLYISO LF SZ6.5 (GLOVE) ×1 IMPLANT
GLOVE SURG POLYISO LF SZ7 (GLOVE) ×1 IMPLANT
GLOVE SURG SIGNA 7.5 PF LTX (GLOVE) ×3 IMPLANT
GLOVE SURG UNDER POLY LF SZ6.5 (GLOVE) ×1 IMPLANT
GLOVE SURG UNDER POLY LF SZ7 (GLOVE) ×1 IMPLANT
GOWN STRL REUS W/ TWL LRG LVL3 (GOWN DISPOSABLE) ×2 IMPLANT
GOWN STRL REUS W/ TWL XL LVL3 (GOWN DISPOSABLE) ×2 IMPLANT
GOWN STRL REUS W/TWL LRG LVL3 (GOWN DISPOSABLE) ×6
GOWN STRL REUS W/TWL XL LVL3 (GOWN DISPOSABLE) ×2
MESH PARIETEX PROGRIP RIGHT (Mesh General) ×1 IMPLANT
NDL HYPO 25X1 1.5 SAFETY (NEEDLE) ×2 IMPLANT
NEEDLE HYPO 25X1 1.5 SAFETY (NEEDLE) ×2 IMPLANT
NS IRRIG 1000ML POUR BTL (IV SOLUTION) IMPLANT
PACK BASIN DAY SURGERY FS (CUSTOM PROCEDURE TRAY) ×3 IMPLANT
PENCIL SMOKE EVACUATOR (MISCELLANEOUS) ×3 IMPLANT
SLEEVE SCD COMPRESS KNEE MED (STOCKING) ×3 IMPLANT
SPONGE T-LAP 4X18 ~~LOC~~+RFID (SPONGE) ×3 IMPLANT
SUT MNCRL AB 4-0 PS2 18 (SUTURE) ×3 IMPLANT
SUT VIC AB 2-0 CT1 27 (SUTURE) ×4
SUT VIC AB 2-0 CT1 TAPERPNT 27 (SUTURE) ×4 IMPLANT
SUT VIC AB 3-0 CT1 27 (SUTURE) ×2
SUT VIC AB 3-0 CT1 27XBRD (SUTURE) ×2 IMPLANT
SYR CONTROL 10ML LL (SYRINGE) ×3 IMPLANT
TOWEL GREEN STERILE FF (TOWEL DISPOSABLE) ×3 IMPLANT

## 2021-11-25 NOTE — Anesthesia Preprocedure Evaluation (Addendum)
Anesthesia Evaluation  ?Patient identified by MRN, date of birth, ID band ?Patient awake ? ? ? ?Reviewed: ?Allergy & Precautions, NPO status , Patient's Chart, lab work & pertinent test results ? ?Airway ?Mallampati: II ? ?TM Distance: >3 FB ?Neck ROM: Full ? ? ? Dental ? ?(+) Edentulous Lower, Upper Dentures ?  ?Pulmonary ?COPD,  COPD inhaler, Current Smoker,  ?  ?Pulmonary exam normal ? ? ? ? ? ? ? Cardiovascular ?negative cardio ROS ?Normal cardiovascular exam ? ?ECG: NSR, rate 67 ?  ?Neuro/Psych ?negative neurological ROS ? negative psych ROS  ? GI/Hepatic ?negative GI ROS, (+)  ?  ? substance abuse ? , Hepatitis -, C  ?Endo/Other  ?negative endocrine ROS ? Renal/GU ?negative Renal ROS  ? ?  ?Musculoskeletal ? ?(+) Arthritis , narcotic dependentChronic pain  ? Abdominal ?  ?Peds ? Hematology ?negative hematology ROS ?(+)   ?Anesthesia Other Findings ?RIGHT INGUINAL HERNIA ? Reproductive/Obstetrics ? ?  ? ? ? ? ? ? ? ? ? ? ? ? ? ?  ?  ? ? ? ? ? ? ? ?Anesthesia Physical ?Anesthesia Plan ? ?ASA: 3 ? ?Anesthesia Plan: General and Regional  ? ?Post-op Pain Management:   ? ?Induction: Intravenous ? ?PONV Risk Score and Plan: 1 and Ondansetron, Dexamethasone, Midazolam and Treatment may vary due to age or medical condition ? ?Airway Management Planned: LMA ? ?Additional Equipment:  ? ?Intra-op Plan:  ? ?Post-operative Plan: Extubation in OR ? ?Informed Consent: I have reviewed the patients History and Physical, chart, labs and discussed the procedure including the risks, benefits and alternatives for the proposed anesthesia with the patient or authorized representative who has indicated his/her understanding and acceptance.  ? ? ? ? ? ?Plan Discussed with: CRNA ? ?Anesthesia Plan Comments:   ? ? ? ? ? ?Anesthesia Quick Evaluation ? ?

## 2021-11-25 NOTE — Anesthesia Postprocedure Evaluation (Signed)
Anesthesia Post Note ? ?Patient: Curtis Baldwin ? ?Procedure(s) Performed: OPEN RIGHT INGUINAL HERNIA REPAIR WITH MESH (Right: Groin) ? ?  ? ?Patient location during evaluation: Phase II ?Anesthesia Type: General ?Level of consciousness: awake and alert and oriented ?Pain management: pain level controlled ?Vital Signs Assessment: post-procedure vital signs reviewed and stable ?Respiratory status: spontaneous breathing, nonlabored ventilation and respiratory function stable ?Cardiovascular status: blood pressure returned to baseline and stable ?Postop Assessment: no apparent nausea or vomiting ?Anesthetic complications: no ? ? ?No notable events documented. ? ?Last Vitals:  ?Vitals:  ? 11/25/21 1345 11/25/21 1405  ?BP: (!) 129/97 137/67  ?Pulse: 62 (!) 57  ?Resp: 17 16  ?Temp:  36.5 ?C  ?SpO2: 97% 99%  ?  ?Last Pain:  ?Vitals:  ? 11/25/21 1354  ?TempSrc:   ?PainSc: 0-No pain  ? ? ?  ?  ?  ?  ?  ?  ? ?[name] A. ? ? ? ? ?

## 2021-11-25 NOTE — Op Note (Addendum)
OPEN RIGHT INGUINAL HERNIA REPAIR WITH MESH  Procedure Note ? ?Wynonia Hazard ?11/25/2021 ? ? ?Pre-op Diagnosis: RIGHT INGUINAL HERNIA ?    ?Post-op Diagnosis: same ? ?Procedure(s): ?OPEN RIGHT INGUINAL HERNIA REPAIR WITH MESH ? ?Surgeon(s): ?Coralie Keens, MD ?Cameron Sprang MD [facility] Resident ? ?Anesthesia: General ? ?Staff:  ?Circulator: Eston Esters, RN ?Relief Circulator: Izora Ribas, RN ?Scrub Person: Patric Dykes, RN ? ?Estimated Blood Loss: Minimal ?              ?Findings: The patient was found to have a direct right inguinal hernia which was repaired with a piece of large Prolene ProGrip mesh from [employer] ? ?Procedure: The patient was brought to the operating room identifies a correct patient.  He was placed upon on the operating room table and general anesthesia was induced.  His abdomen was then prepped and draped in usual sterile fashion.  The skin in the right inguinal area was anesthetized with Marcaine and a small longitudinal incision was created with a scalpel.  We then dissected down through Scarpa's fascia to the external oblique fascia.  The external oblique fascia was then opened toward the internal and external rings.  The testicular cord and structures was then controlled with a Penrose drain.  The patient had a direct hernia defect without evidence of an indirect hernia.  We were able to reduce the sac and closed some of the soft tissue over the top of the hernia sac with a 2-0 Vicryl suture.  A large piece of Prolene ProGrip mesh was then brought to the field.  It was placed against the pubic tubercle and sewn in place and then brought around the cord structures back to the pubic tubercle.  Wide coverage of the inguinal floor and direct defect appeared to be achieved.  Another suture was placed at the shelving edge of the inguinal ligament.  We then closed the external bleak fascia over the top of the mesh with a running 2-0 Vicryl suture.  Scarpa's fascia was then closed with  interrupted 3-0 Vicryl sutures and the skin was closed with a running 4-0 Monocryl.  Dermabond was then applied.  The patient tolerated the procedure well.  All the counts were correct at the end of the procedure.  The patient was then extubated in the operating room and taken in stable condition to the recovery room. ?        ? ?Coralie Keens  ? ?Date: 11/25/2021  Time: 12:50 PM ? ? ? ?

## 2021-11-25 NOTE — Anesthesia Procedure Notes (Signed)
Anesthesia Regional Block: TAP block  ? ?Pre-Anesthetic Checklist: , timeout performed,  Correct Patient, Correct Site, Correct Laterality,  Correct Procedure, Correct Position, site marked,  Risks and benefits discussed,  Surgical consent,  Pre-op evaluation,  At surgeon's request and post-op pain management ? ?Laterality: Right ? ?Prep: chloraprep     ?  ?Needles:  ?Injection technique: Single-shot ? ?Needle Type: Echogenic Stimulator Needle   ? ? ?Needle Length: 10cm  ?Needle Gauge: 20  ? ? ? ?Additional Needles: ? ? ?Procedures:,,,, ultrasound used (permanent image in chart),,    ?Narrative:  ?Start time: 11/25/2021 10:15 AM ?End time: 11/25/2021 10:25 AM ?Injection made incrementally with aspirations every 5 mL. ? ?Performed by: Personally  ?Anesthesiologist: Murvin Natal, MD ? ?Additional Notes: ?Functioning IV was confirmed and monitors were applied.  A timeout was performed. Sterile prep, hand hygiene and sterile gloves were used. A 134m 20ga Bbraun echogenic stimulator needle was used. Negative aspiration and negative test dose prior to incremental administration of local anesthetic. The patient tolerated the procedure well. ? ?Ultrasound guidance: relevent anatomy identified, needle position confirmed, local anesthetic spread visualized around nerve(s), vascular puncture avoided.  Image printed for medical record.  ? ? ? ? ? ?

## 2021-11-25 NOTE — Progress Notes (Signed)
Assisted Dr. Ellender with right, transabdominal plane, ultrasound guided block. Side rails up, monitors on throughout procedure. See vital signs in flow sheet. Tolerated Procedure well. 

## 2021-11-25 NOTE — Transfer of Care (Signed)
Immediate Anesthesia Transfer of Care Note ? ?Patient: Curtis Baldwin ? ?Procedure(s) Performed: OPEN RIGHT INGUINAL HERNIA REPAIR WITH MESH (Right: Groin) ? ?Patient Location: PACU ? ?Anesthesia Type:GA combined with regional for post-op pain ? ?Level of Consciousness: awake, alert  and oriented ? ?Airway & Oxygen Therapy: Patient Spontanous Breathing and Patient connected to face mask oxygen ? ?Post-op Assessment: Report given to RN and Post -op Vital signs reviewed and stable ? ?Post vital signs: Reviewed and stable ? ?Last Vitals:  ?Vitals Value Taken Time  ?BP 132/75 11/25/21 1312  ?Temp    ?Pulse 61 11/25/21 1314  ?Resp 16 11/25/21 1314  ?SpO2 100 % 11/25/21 1314  ?Vitals shown include unvalidated device data. ? ?Last Pain:  ?Vitals:  ? 11/25/21 0923  ?TempSrc: Oral  ?PainSc: 10-Worst pain ever  ?   ? ?Patients Stated Pain Goal: 6 (11/25/21 7939) ? ?Complications: No notable events documented. ?

## 2021-11-25 NOTE — Anesthesia Procedure Notes (Signed)
Procedure Name: LMA Insertion ?Date/Time: 11/25/2021 12:11 PM ?Performed by: Lavonia Dana, CRNA ?Pre-anesthesia Checklist: Patient identified, Emergency Drugs available, Suction available and Patient being monitored ?Patient Re-evaluated:Patient Re-evaluated prior to induction ?Oxygen Delivery Method: Circle system utilized ?Preoxygenation: Pre-oxygenation with 100% oxygen ?Induction Type: IV induction ?Ventilation: Mask ventilation without difficulty ?LMA: LMA inserted ?LMA Size: 5.0 ?Number of attempts: 1 ?Airway Equipment and Method: Bite block ?Placement Confirmation: positive ETCO2 ?Tube secured with: Tape ?Dental Injury: Teeth and Oropharynx as per pre-operative assessment  ? ? ? ? ?

## 2021-11-25 NOTE — Discharge Instructions (Addendum)
[facility] _______[facility] ? ?UMBILICAL OR INGUINAL HERNIA REPAIR: POST OP INSTRUCTIONS ? ?Always review your discharge instruction sheet given to you by the facility where your surgery was performed. ?IF YOU HAVE DISABILITY OR FAMILY LEAVE FORMS, YOU MUST BRING THEM TO THE OFFICE FOR PROCESSING.   ?DO NOT GIVE THEM TO YOUR DOCTOR. ? ?1. A  prescription for pain medication may be given to you upon discharge.  Take your pain medication as prescribed, if needed.  If narcotic pain medicine is not needed, then you may take acetaminophen (Tylenol) or ibuprofen (Advil) as needed. ?2. Take your usually prescribed medications unless otherwise directed. ?If you need a refill on your pain medication, please contact your pharmacy.  They will contact our office to request authorization. Prescriptions will not be filled after 5 pm or on week-ends. ?3. You should follow a light diet the first 24 hours after arrival home, such as soup and crackers, etc.  Be sure to include lots of fluids daily.  Resume your normal diet the day after surgery. ?4.Most patients will experience some swelling and bruising around the umbilicus or in the groin and scrotum.  Ice packs and reclining will help.  Swelling and bruising can take several days to resolve.  ?6. It is common to experience some constipation if taking pain medication after surgery.  Increasing fluid intake and taking a stool softener (such as Colace) will usually help or prevent this problem from occurring.  A mild laxative (Milk of Magnesia or Miralax) should be taken according to package directions if there are no bowel movements after 48 hours. ?7. Unless discharge instructions indicate otherwise, you may remove your bandages 24-48 hours after surgery, and you may shower at that time.  You may have steri-strips (small skin tapes) in place directly over the incision.  These strips should be left on the skin for 7-10 days.  If your surgeon used skin glue on the  incision, you may shower in 24 hours.  The glue will flake off over the next 2-3 weeks.  Any sutures or staples will be removed at the office during your follow-up visit. ?8. ACTIVITIES:  You may resume regular (light) daily activities beginning the next day--such as daily self-care, walking, climbing stairs--gradually increasing activities as tolerated.  You may have sexual intercourse when it is comfortable.  Refrain from any heavy lifting or straining until approved by your doctor. ? ?a.You may drive when you are no longer taking prescription pain medication, you can comfortably wear a seatbelt, and you can safely maneuver your car and apply brakes. ?b.RETURN TO WORK:   ?_____________________________________________ ? ?9.You should see your doctor in the office for a follow-up appointment approximately 2-3 weeks after your surgery.  Make sure that you call for this appointment within a day or two after you arrive home to insure a convenient appointment time. ?10.OTHER INSTRUCTIONS: __ok to shower starting tomorrow ?Ice pack and tylenol also for pain ?No lifting more than 15 pounds for 4 weeks_______________________ ?   _____________________________________ ? ?WHEN TO CALL YOUR DOCTOR: ?Fever over 101.0 ?Inability to urinate ?Nausea and/or vomiting ?Extreme swelling or bruising ?Continued bleeding from incision. ?Increased pain, redness, or drainage from the incision ? ?The clinic staff is available to answer your questions during regular business hours.  Please don?t hesitate to call and ask to speak to one of the nurses for clinical concerns.  If you have a medical emergency, go to the nearest emergency room or call 911.  A  surgeon from [facility] is always on call at the hospital ? ? ?[address], [place], [place], [place]  [address] ? ? [address], [place], [place]   [address] ?[phone] ? [phone] ? FAX [phone] ?Web site: [email or web address]  ?Post Anesthesia  Home Care Instructions ? ?Activity: ?Get plenty of rest for the remainder of the day. A responsible individual must stay with you for 24 hours following the procedure.  ?For the next 24 hours, DO NOT: ?-Drive a car ?-Paediatric nurse ?-Drink alcoholic beverages ?-Take any medication unless instructed by your physician ?-Make any legal decisions or sign important papers. ? ?Meals: ?Start with liquid foods such as gelatin or soup. Progress to regular foods as tolerated. Avoid greasy, spicy, heavy foods. If nausea and/or vomiting occur, drink only clear liquids until the nausea and/or vomiting subsides. Call your physician if vomiting continues. ? ?Special Instructions/Symptoms: ?Your throat may feel dry or sore from the anesthesia or the breathing tube placed in your throat during surgery. If this causes discomfort, gargle with warm salt water. The discomfort should disappear within 24 hours. ? ?If you had a scopolamine patch placed behind your ear for the management of post- operative nausea and/or vomiting: ? ?1. The medication in the patch is effective for 72 hours, after which it should be removed.  Wrap patch in a tissue and discard in the trash. Wash hands thoroughly with soap and water. ?2. You may remove the patch earlier than 72 hours if you experience unpleasant side effects which may include dry mouth, dizziness or visual disturbances. ?3. Avoid touching the patch. Wash your hands with soap and water after contact with the patch. ?    ? ? ?Next dose of Ttylenol may be given at 4P ?

## 2021-11-26 ENCOUNTER — Encounter (HOSPITAL_BASED_OUTPATIENT_CLINIC_OR_DEPARTMENT_OTHER): Payer: Self-pay | Admitting: Surgery

## 2022-03-19 ENCOUNTER — Encounter: Payer: Self-pay | Admitting: Orthopedic Surgery

## 2022-03-19 ENCOUNTER — Ambulatory Visit: Payer: Medicaid Other | Admitting: Orthopedic Surgery

## 2022-03-19 DIAGNOSIS — M7541 Impingement syndrome of right shoulder: Secondary | ICD-10-CM

## 2022-03-19 DIAGNOSIS — M7542 Impingement syndrome of left shoulder: Secondary | ICD-10-CM | POA: Diagnosis not present

## 2022-03-19 MED ORDER — METHYLPREDNISOLONE ACETATE 40 MG/ML IJ SUSP
40.0000 mg | Freq: Once | INTRAMUSCULAR | Status: AC
  Administered 2022-03-19: 40 mg via INTRA_ARTICULAR

## 2022-03-19 NOTE — Addendum Note (Signed)
Addended byCandice Camp on: 03/19/2022 12:28 PM   Modules accepted: Orders

## 2022-03-19 NOTE — Progress Notes (Signed)
Encounter Diagnoses  Name Primary?   Impingement syndrome of left shoulder Yes   Impingement syndrome of right shoulder     Chief Complaint  Patient presents with   Injections    Shoulders     Procedure injection right shoulder subacromial joint and left shoulder subacromial joint   procedure note the subacromial injection shoulder RIGHT  Verbal consent was obtained to inject the  RIGHT   Shoulder  Timeout was completed to confirm the injection site is a subacromial space of the  RIGHT  shoulder   Medication used Depo-Medrol 40 mg and lidocaine 1% 3 cc  Anesthesia was provided by ethyl chloride  The injection was performed in the RIGHT  posterior subacromial space. After pinning the skin with alcohol and anesthetized the skin with ethyl chloride the subacromial space was injected using a 20-gauge needle. There were no complications  Sterile dressing was applied.    Procedure note the subacromial injection shoulder left   Verbal consent was obtained to inject the  Left   Shoulder  Timeout was completed to confirm the injection site is a subacromial space of the  left  shoulder  Medication used Depo-Medrol 40 mg and lidocaine 1% 3 cc  Anesthesia was provided by ethyl chloride  The injection was performed in the left  posterior subacromial space. After pinning the skin with alcohol and anesthetized the skin with ethyl chloride the subacromial space was injected using a 20-gauge needle. There were no complications  Sterile dressing was applied.

## 2022-05-19 NOTE — Progress Notes (Signed)
No chief complaint on file.

## 2022-06-28 ENCOUNTER — Ambulatory Visit (INDEPENDENT_AMBULATORY_CARE_PROVIDER_SITE_OTHER): Payer: Medicaid Other | Admitting: Orthopedic Surgery

## 2022-06-28 DIAGNOSIS — M7541 Impingement syndrome of right shoulder: Secondary | ICD-10-CM

## 2022-06-28 DIAGNOSIS — M7542 Impingement syndrome of left shoulder: Secondary | ICD-10-CM | POA: Diagnosis not present

## 2022-06-28 NOTE — Progress Notes (Unsigned)
Chief Complaint  Patient presents with   Left Shoulder - Pain    Request injections    Right Shoulder - Pain     Procedure Note  Patient: Curtis Baldwin             Date of Birth: Apr 20, 1961           MRN: [id number]             Visit Date: 06/28/2022  Procedures: Visit Diagnoses:  1. Impingement syndrome of left shoulder   2. Impingement syndrome of right shoulder     Large Joint Inj: bilateral subacromial bursa on 06/28/2022 4:54 PM Indications: pain Details: (20) 1.5 in needle, posterior approach  Arthrogram: No  Medications (Right): 5 mL lidocaine 1 %; 40 mg methylPREDNISolone acetate 40 MG/ML Medications (Left): 5 mL lidocaine 1 %; 40 mg methylPREDNISolone acetate 40 MG/ML  Bilateral injections Procedure, treatment alternatives, risks and benefits explained, specific risks discussed. Consent was given by the patient. Immediately prior to procedure a time out was called to verify the correct patient, procedure, equipment, support staff and site/side marked as required. Patient was prepped and draped in the usual sterile fashion.

## 2022-06-28 NOTE — Patient Instructions (Signed)

## 2022-06-29 MED ORDER — LIDOCAINE HCL 1 % IJ SOLN
5.0000 mL | INTRAMUSCULAR | Status: AC | PRN
  Administered 2022-06-28: 5 mL

## 2022-06-29 MED ORDER — METHYLPREDNISOLONE ACETATE 40 MG/ML IJ SUSP
40.0000 mg | INTRAMUSCULAR | Status: AC | PRN
  Administered 2022-06-28: 40 mg via INTRA_ARTICULAR

## 2022-10-21 ENCOUNTER — Encounter: Payer: Self-pay | Admitting: Radiology

## 2023-01-27 ENCOUNTER — Ambulatory Visit (INDEPENDENT_AMBULATORY_CARE_PROVIDER_SITE_OTHER): Payer: Medicaid Other | Admitting: Orthopedic Surgery

## 2023-01-27 ENCOUNTER — Encounter: Payer: Self-pay | Admitting: Orthopedic Surgery

## 2023-01-27 VITALS — BP 138/74 | HR 72

## 2023-01-27 DIAGNOSIS — M7541 Impingement syndrome of right shoulder: Secondary | ICD-10-CM | POA: Diagnosis not present

## 2023-01-27 DIAGNOSIS — M7542 Impingement syndrome of left shoulder: Secondary | ICD-10-CM | POA: Diagnosis not present

## 2023-01-27 NOTE — Progress Notes (Signed)
Chief Complaint  Patient presents with   Shoulder Pain    Injections bil shoulder with pain and numbness to both hands  trouble sleeping   Mr. Curtis Baldwin comes in from time to time for bilateral shoulder injections  Although I am happy to give him injections at his request he is also having myelopathic symptoms.  He was seen earlier today and tells me that he has an MRI scheduled.  Since we do not do neck surgery that will be handled by his primary care physician with appropriate referral  Procedure injection right shoulder subacromial joint and left shoulder subacromial joint   procedure note the subacromial injection shoulder RIGHT  Verbal consent was obtained to inject the  RIGHT   Shoulder  Timeout was completed to confirm the injection site is a subacromial space of the  RIGHT  shoulder   Medication used Depo-Medrol 40 mg and lidocaine 1% 3 cc  Anesthesia was provided by ethyl chloride  The injection was performed in the RIGHT  posterior subacromial space. After pinning the skin with alcohol and anesthetized the skin with ethyl chloride the subacromial space was injected using a 20-gauge needle. There were no complications  Sterile dressing was applied.  Encounter Diagnoses  Name Primary?   Impingement syndrome of right shoulder Yes   Impingement syndrome of left shoulder      Procedure note the subacromial injection shoulder left   Verbal consent was obtained to inject the  Left   Shoulder  Timeout was completed to confirm the injection site is a subacromial space of the  left  shoulder  Medication used Depo-Medrol 40 mg and lidocaine 1% 3 cc  Anesthesia was provided by ethyl chloride  The injection was performed in the left  posterior subacromial space. After pinning the skin with alcohol and anesthetized the skin with ethyl chloride the subacromial space was injected using a 20-gauge needle. There were no complications  Sterile dressing was applied.

## 2023-01-27 NOTE — Patient Instructions (Signed)
If you dont hear from the other doctor about the MRI on your neck let us know and we can try to schedule

## 2023-01-31 ENCOUNTER — Telehealth: Payer: Self-pay | Admitting: Orthopedic Surgery

## 2023-01-31 NOTE — Telephone Encounter (Signed)
Yes, I thought we had seen him for it in the past but we have not I called to let him know we can not. Apologized for the confusion

## 2023-01-31 NOTE — Telephone Encounter (Signed)
Last time he was here he said another doctor was ordering MRI when we asked him, now he is asking if we can get this done for him?   Ok to order C spine MRI ?

## 2023-01-31 NOTE — Telephone Encounter (Signed)
Dr. Mort Sawyers pt - the patient is wanting to see if we can get his neck MRI scheduled for him.  Dr. Mort Sawyers previous note stated that we may can help him get that scheduled.  [phone]

## 2023-10-10 ENCOUNTER — Ambulatory Visit: Payer: Medicare Other | Admitting: Orthopedic Surgery

## 2023-10-24 ENCOUNTER — Ambulatory Visit (INDEPENDENT_AMBULATORY_CARE_PROVIDER_SITE_OTHER): Payer: Medicare Other | Admitting: Orthopedic Surgery

## 2023-10-24 DIAGNOSIS — M7541 Impingement syndrome of right shoulder: Secondary | ICD-10-CM

## 2023-10-24 DIAGNOSIS — M7542 Impingement syndrome of left shoulder: Secondary | ICD-10-CM

## 2023-10-24 MED ORDER — METHYLPREDNISOLONE ACETATE 40 MG/ML IJ SUSP
40.0000 mg | Freq: Once | INTRAMUSCULAR | Status: AC
  Administered 2023-10-24: 40 mg via INTRA_ARTICULAR

## 2023-10-24 MED ORDER — METHYLPREDNISOLONE ACETATE 40 MG/ML IJ SUSP
40.0000 mg | Freq: Once | INTRAMUSCULAR | Status: AC
Start: 2023-10-24 — End: 2023-10-24
  Administered 2023-10-24: 40 mg via INTRA_ARTICULAR

## 2023-10-24 NOTE — Progress Notes (Signed)
 Chief Complaint  Patient presents with   Injections    Both shoulders     Encounter Diagnoses  Name Primary?   Impingement syndrome of right shoulder Yes   Impingement syndrome of left shoulder     Procedure injection right shoulder subacromial joint and left shoulder subacromial joint   procedure note the subacromial injection shoulder RIGHT  Verbal consent was obtained to inject the  RIGHT   Shoulder  Timeout was completed to confirm the injection site is a subacromial space of the  RIGHT  shoulder   Medication used Depo-Medrol 40 mg and lidocaine 1% 3 cc  Anesthesia was provided by ethyl chloride  The injection was performed in the RIGHT  posterior subacromial space. After pinning the skin with alcohol and anesthetized the skin with ethyl chloride the subacromial space was injected using a 20-gauge needle. There were no complications  Sterile dressing was applied.    Procedure note the subacromial injection shoulder left   Verbal consent was obtained to inject the  Left   Shoulder  Timeout was completed to confirm the injection site is a subacromial space of the  left  shoulder  Medication used Depo-Medrol 40 mg and lidocaine 1% 3 cc  Anesthesia was provided by ethyl chloride  The injection was performed in the left  posterior subacromial space. After pinning the skin with alcohol and anesthetized the skin with ethyl chloride the subacromial space was injected using a 20-gauge needle. There were no complications  Sterile dressing was applied.

## 2023-12-01 ENCOUNTER — Encounter (INDEPENDENT_AMBULATORY_CARE_PROVIDER_SITE_OTHER): Payer: Self-pay | Admitting: *Deleted

## 2023-12-08 ENCOUNTER — Telehealth (INDEPENDENT_AMBULATORY_CARE_PROVIDER_SITE_OTHER): Payer: Self-pay | Admitting: *Deleted

## 2023-12-08 NOTE — Telephone Encounter (Signed)
 Received vm that he received a letter in mail that it was time for his colonoscopy and said he was going to [facility] now and is being seeing by GI there and has already had colonoscopy since the one done here  and was going to disregard the letter he recived from our office and for us  to take him out of the system.

## 2023-12-08 NOTE — Telephone Encounter (Signed)
 noted

## 2024-02-02 ENCOUNTER — Ambulatory Visit: Admitting: Orthopedic Surgery

## 2024-02-02 ENCOUNTER — Encounter: Payer: Self-pay | Admitting: Orthopedic Surgery

## 2024-02-02 DIAGNOSIS — M7541 Impingement syndrome of right shoulder: Secondary | ICD-10-CM

## 2024-02-02 DIAGNOSIS — M7542 Impingement syndrome of left shoulder: Secondary | ICD-10-CM | POA: Diagnosis not present

## 2024-02-02 MED ORDER — METHYLPREDNISOLONE ACETATE 40 MG/ML IJ SUSP
40.0000 mg | Freq: Once | INTRAMUSCULAR | Status: AC
  Administered 2024-02-02: 40 mg via INTRA_ARTICULAR

## 2024-02-02 NOTE — Progress Notes (Signed)
 Chief Complaint  Patient presents with   Injections    Shoulders     Encounter Diagnoses  Name Primary?   Impingement syndrome of left shoulder Yes   Impingement syndrome of right shoulder     Curtis Baldwin got good relief from his last injections and said he was doing well until he tried to crank his lawn more comes in for bilateral injections that he has requested    Procedure injection right shoulder subacromial joint and left shoulder subacromial joint   procedure note the subacromial injection shoulder RIGHT  Verbal consent was obtained to inject the  RIGHT   Shoulder  Timeout was completed to confirm the injection site is a subacromial space of the  RIGHT  shoulder   Medication used Depo-Medrol  40 mg and lidocaine  1% 3 cc  Anesthesia was provided by ethyl chloride  The injection was performed in the RIGHT  posterior subacromial space. After pinning the skin with alcohol and anesthetized the skin with ethyl chloride the subacromial space was injected using a 20-gauge needle. There were no complications  Sterile dressing was applied.    Procedure note the subacromial injection shoulder left   Verbal consent was obtained to inject the  Left   Shoulder  Timeout was completed to confirm the injection site is a subacromial space of the  left  shoulder  Medication used Depo-Medrol  40 mg and lidocaine  1% 3 cc  Anesthesia was provided by ethyl chloride  The injection was performed in the left  posterior subacromial space. After pinning the skin with alcohol and anesthetized the skin with ethyl chloride the subacromial space was injected using a 20-gauge needle. There were no complications  Sterile dressing was applied.

## 2024-02-10 ENCOUNTER — Other Ambulatory Visit (HOSPITAL_COMMUNITY): Payer: Self-pay | Admitting: Internal Medicine

## 2024-02-10 DIAGNOSIS — M5412 Radiculopathy, cervical region: Secondary | ICD-10-CM

## 2024-06-07 ENCOUNTER — Ambulatory Visit (INDEPENDENT_AMBULATORY_CARE_PROVIDER_SITE_OTHER): Admitting: Orthopedic Surgery

## 2024-06-07 ENCOUNTER — Encounter: Payer: Self-pay | Admitting: Orthopedic Surgery

## 2024-06-07 DIAGNOSIS — M7541 Impingement syndrome of right shoulder: Secondary | ICD-10-CM | POA: Diagnosis not present

## 2024-06-07 DIAGNOSIS — M7542 Impingement syndrome of left shoulder: Secondary | ICD-10-CM | POA: Diagnosis not present

## 2024-06-07 MED ORDER — METHYLPREDNISOLONE ACETATE 40 MG/ML IJ SUSP
40.0000 mg | Freq: Once | INTRAMUSCULAR | Status: AC
Start: 2024-06-07 — End: 2024-06-07
  Administered 2024-06-07: 40 mg via INTRA_ARTICULAR

## 2024-06-07 MED ORDER — METHYLPREDNISOLONE ACETATE 40 MG/ML IJ SUSP
40.0000 mg | Freq: Once | INTRAMUSCULAR | Status: AC
  Administered 2024-06-07: 40 mg via INTRA_ARTICULAR

## 2024-06-07 NOTE — Progress Notes (Signed)
 Chief Complaint  Patient presents with   Injections    Both shoulders    Encounter Diagnoses  Name Primary?   Impingement syndrome of left shoulder Yes   Impingement syndrome of right shoulder     Procedure injection right shoulder subacromial joint and left shoulder subacromial joint   procedure note the subacromial injection shoulder RIGHT  Verbal consent was obtained to inject the  RIGHT   Shoulder  Timeout was completed to confirm the injection site is a subacromial space of the  RIGHT  shoulder   Medication used Depo-Medrol  40 mg and lidocaine  1% 3 cc  Anesthesia was provided by ethyl chloride  The injection was performed in the RIGHT  posterior subacromial space. After pinning the skin with alcohol and anesthetized the skin with ethyl chloride the subacromial space was injected using a 20-gauge needle. There were no complications  Sterile dressing was applied.    Procedure note the subacromial injection shoulder left   Verbal consent was obtained to inject the  Left   Shoulder  Timeout was completed to confirm the injection site is a subacromial space of the  left  shoulder  Medication used Depo-Medrol  40 mg and lidocaine  1% 3 cc  Anesthesia was provided by ethyl chloride  The injection was performed in the left  posterior subacromial space. After pinning the skin with alcohol and anesthetized the skin with ethyl chloride the subacromial space was injected using a 20-gauge needle. There were no complications  Sterile dressing was applied.
